# Patient Record
Sex: Female | Born: 1955 | Race: White | Hispanic: No | Smoking: Former smoker
Health system: Southern US, Community
[De-identification: ages and names within clinical notes are randomized; demographics above are authoritative.]

## PROBLEM LIST (undated history)

## (undated) DIAGNOSIS — I10 Essential (primary) hypertension: Secondary | ICD-10-CM

## (undated) DIAGNOSIS — E7800 Pure hypercholesterolemia, unspecified: Secondary | ICD-10-CM

## (undated) DIAGNOSIS — E119 Type 2 diabetes mellitus without complications: Secondary | ICD-10-CM

## (undated) DIAGNOSIS — M1990 Unspecified osteoarthritis, unspecified site: Secondary | ICD-10-CM

## (undated) HISTORY — PX: ABDOMINAL HYSTERECTOMY: SHX81

## (undated) HISTORY — PX: FRACTURE SURGERY: SHX138

---

## 1998-01-23 ENCOUNTER — Other Ambulatory Visit: Admission: RE | Admit: 1998-01-23 | Discharge: 1998-01-23 | Payer: Self-pay | Admitting: Obstetrics & Gynecology

## 1998-06-21 ENCOUNTER — Inpatient Hospital Stay (HOSPITAL_COMMUNITY): Admission: RE | Admit: 1998-06-21 | Discharge: 1998-06-24 | Payer: Self-pay | Admitting: Obstetrics & Gynecology

## 1999-08-23 ENCOUNTER — Encounter: Admission: RE | Admit: 1999-08-23 | Discharge: 1999-08-23 | Payer: Self-pay | Admitting: Obstetrics and Gynecology

## 1999-08-23 ENCOUNTER — Encounter: Payer: Self-pay | Admitting: Obstetrics and Gynecology

## 2000-10-14 ENCOUNTER — Encounter: Admission: RE | Admit: 2000-10-14 | Discharge: 2000-10-14 | Payer: Self-pay | Admitting: Obstetrics and Gynecology

## 2000-10-14 ENCOUNTER — Encounter: Payer: Self-pay | Admitting: Obstetrics and Gynecology

## 2000-10-30 ENCOUNTER — Other Ambulatory Visit: Admission: RE | Admit: 2000-10-30 | Discharge: 2000-10-30 | Payer: Self-pay | Admitting: Obstetrics and Gynecology

## 2001-08-10 ENCOUNTER — Ambulatory Visit (HOSPITAL_COMMUNITY): Admission: RE | Admit: 2001-08-10 | Discharge: 2001-08-10 | Payer: Self-pay | Admitting: Gastroenterology

## 2001-08-10 ENCOUNTER — Encounter (INDEPENDENT_AMBULATORY_CARE_PROVIDER_SITE_OTHER): Payer: Self-pay | Admitting: Specialist

## 2002-01-06 ENCOUNTER — Other Ambulatory Visit: Admission: RE | Admit: 2002-01-06 | Discharge: 2002-01-06 | Payer: Self-pay | Admitting: Obstetrics and Gynecology

## 2003-01-27 ENCOUNTER — Encounter: Payer: Self-pay | Admitting: Obstetrics and Gynecology

## 2003-01-27 ENCOUNTER — Encounter: Admission: RE | Admit: 2003-01-27 | Discharge: 2003-01-27 | Payer: Self-pay | Admitting: Obstetrics and Gynecology

## 2005-06-06 ENCOUNTER — Encounter: Admission: RE | Admit: 2005-06-06 | Discharge: 2005-06-06 | Payer: Self-pay | Admitting: Obstetrics and Gynecology

## 2006-08-19 ENCOUNTER — Encounter: Admission: RE | Admit: 2006-08-19 | Discharge: 2006-08-19 | Payer: Self-pay | Admitting: Obstetrics and Gynecology

## 2008-06-01 ENCOUNTER — Encounter: Admission: RE | Admit: 2008-06-01 | Discharge: 2008-06-01 | Payer: Self-pay | Admitting: Obstetrics and Gynecology

## 2008-06-10 ENCOUNTER — Encounter: Admission: RE | Admit: 2008-06-10 | Discharge: 2008-06-10 | Payer: Self-pay | Admitting: Internal Medicine

## 2009-06-23 ENCOUNTER — Encounter: Admission: RE | Admit: 2009-06-23 | Discharge: 2009-06-23 | Payer: Self-pay | Admitting: Obstetrics and Gynecology

## 2010-09-23 ENCOUNTER — Encounter: Payer: Self-pay | Admitting: Internal Medicine

## 2011-01-18 NOTE — Procedures (Signed)
[place]  Patient:    Christine Lopez, Christine Lopez Visit Number: [id number] MRN: [id number]          Service Type: END Location: ENDO Attending Physician:  Nelda Marseille Dictated by:   Petra Kuba, M.D. Proc. Date: 08/10/01 Admit Date:  08/10/2001                             Procedure Report  PROCEDURE:  Colonoscopy with biopsy.  INDICATIONS FOR PROCEDURE:  Some change in bowel habits with loose stools and some weight loss.  CONSENT:  Consent was signed after risks, benefits, methods, and options were thoroughly discussed in the office.  MEDICATIONS USED: 1. Demerol 50 mg. 2. Versed 7.5 mg.  DESCRIPTION OF PROCEDURE:  Rectal inspection was pertinent for external hemorrhoids, small.  Digital examination was negative.  Pediatric video adjustable colonoscope was inserted, easily advanced to the cecum.  However, to advance into the ileocecal valve, required rolling her on her back and some abdominal pressure.  The cecum was identified by the appendiceal orifice and ileocecal valve.  No obvious abnormality was seen on insertion.  The TI on quick evaluation was normal.  We did take a few biopsies from that area.  The scope was slowly withdrawn.  The prep was adequate.  There was some liquid stool that required washing and suctioning.  On slow withdrawal through the colon, some random biopsies were obtained, but no abnormalities were seen as we slowly withdrew back to the rectum.  They were put in the second container. Once back in the rectum the scope was retroflexed, pertinent for some internal hemorrhoids.  Scope was straightened and readvanced a short ways around the left side of the colon.  Air was suctioned, scope removed.  The patient tolerated the procedure well.  There were no obvious immediate complications.  ENDOSCOPIC DIAGNOSES: 1. Internal external hemorrhoids. 2. Otherwise within normal limits to the terminal ileum, status post  random    biopsies throughout.  PLAN: 1. Await pathology. 2. Continue Robinul since it is helping. 3. Consider Librax next. 4. Follow up p.r.n. or in 2 to 3 months to recheck symptoms, and make sure no    further workup plans are needed. Dictated by:   Petra Kuba, M.D. Attending Physician:  Nelda Marseille DD:  08/10/01 TD:  08/10/01 Job: [phone]

## 2011-05-30 ENCOUNTER — Other Ambulatory Visit: Payer: Self-pay | Admitting: Obstetrics and Gynecology

## 2011-05-30 DIAGNOSIS — Z1231 Encounter for screening mammogram for malignant neoplasm of breast: Secondary | ICD-10-CM

## 2011-06-11 ENCOUNTER — Ambulatory Visit
Admission: RE | Admit: 2011-06-11 | Discharge: 2011-06-11 | Disposition: A | Discharge status: Home / Self Care | Payer: BC Managed Care – PPO | Source: Ambulatory Visit | Attending: Obstetrics and Gynecology | Admitting: Obstetrics and Gynecology

## 2011-06-11 DIAGNOSIS — Z1231 Encounter for screening mammogram for malignant neoplasm of breast: Secondary | ICD-10-CM

## 2012-06-04 ENCOUNTER — Other Ambulatory Visit: Payer: Self-pay | Admitting: Family Medicine

## 2012-06-04 DIAGNOSIS — Z1231 Encounter for screening mammogram for malignant neoplasm of breast: Secondary | ICD-10-CM

## 2012-06-16 ENCOUNTER — Ambulatory Visit
Admission: RE | Admit: 2012-06-16 | Discharge: 2012-06-16 | Disposition: A | Discharge status: Home / Self Care | Payer: BC Managed Care – PPO | Source: Ambulatory Visit | Attending: Family Medicine | Admitting: Family Medicine

## 2012-06-16 DIAGNOSIS — Z1231 Encounter for screening mammogram for malignant neoplasm of breast: Secondary | ICD-10-CM

## 2012-06-29 ENCOUNTER — Other Ambulatory Visit: Payer: Self-pay | Admitting: Gastroenterology

## 2013-05-21 ENCOUNTER — Other Ambulatory Visit: Payer: Self-pay

## 2013-05-21 DIAGNOSIS — Z1231 Encounter for screening mammogram for malignant neoplasm of breast: Secondary | ICD-10-CM

## 2013-06-17 ENCOUNTER — Ambulatory Visit
Admission: RE | Admit: 2013-06-17 | Discharge: 2013-06-17 | Disposition: A | Discharge status: Home / Self Care | Payer: BC Managed Care – PPO | Source: Ambulatory Visit

## 2013-06-17 DIAGNOSIS — Z1231 Encounter for screening mammogram for malignant neoplasm of breast: Secondary | ICD-10-CM

## 2014-06-03 ENCOUNTER — Other Ambulatory Visit: Payer: Self-pay

## 2014-06-03 DIAGNOSIS — Z1239 Encounter for other screening for malignant neoplasm of breast: Secondary | ICD-10-CM

## 2014-06-21 ENCOUNTER — Ambulatory Visit
Admission: RE | Admit: 2014-06-21 | Discharge: 2014-06-21 | Disposition: A | Discharge status: Home / Self Care | Payer: PRIVATE HEALTH INSURANCE | Source: Ambulatory Visit

## 2014-06-21 DIAGNOSIS — Z1239 Encounter for other screening for malignant neoplasm of breast: Secondary | ICD-10-CM

## 2014-06-23 ENCOUNTER — Other Ambulatory Visit: Payer: Self-pay | Admitting: Obstetrics and Gynecology

## 2014-06-23 DIAGNOSIS — R928 Other abnormal and inconclusive findings on diagnostic imaging of breast: Secondary | ICD-10-CM

## 2014-07-18 ENCOUNTER — Ambulatory Visit
Admission: RE | Admit: 2014-07-18 | Discharge: 2014-07-18 | Disposition: A | Discharge status: Home / Self Care | Payer: PRIVATE HEALTH INSURANCE | Source: Ambulatory Visit | Attending: Obstetrics and Gynecology | Admitting: Obstetrics and Gynecology

## 2014-07-18 DIAGNOSIS — R928 Other abnormal and inconclusive findings on diagnostic imaging of breast: Secondary | ICD-10-CM

## 2015-05-07 ENCOUNTER — Encounter (HOSPITAL_COMMUNITY): Payer: Self-pay | Admitting: *Deleted

## 2015-05-07 ENCOUNTER — Emergency Department (HOSPITAL_COMMUNITY)
Admission: EM | Admit: 2015-05-07 | Discharge: 2015-05-07 | Disposition: A | Discharge status: Home / Self Care | Payer: PRIVATE HEALTH INSURANCE | Attending: Emergency Medicine | Admitting: Emergency Medicine

## 2015-05-07 DIAGNOSIS — M79652 Pain in left thigh: Secondary | ICD-10-CM | POA: Diagnosis not present

## 2015-05-07 DIAGNOSIS — I10 Essential (primary) hypertension: Secondary | ICD-10-CM | POA: Diagnosis not present

## 2015-05-07 DIAGNOSIS — Z720 Tobacco use: Secondary | ICD-10-CM | POA: Diagnosis not present

## 2015-05-07 DIAGNOSIS — M25552 Pain in left hip: Secondary | ICD-10-CM

## 2015-05-07 DIAGNOSIS — M5442 Lumbago with sciatica, left side: Secondary | ICD-10-CM | POA: Diagnosis not present

## 2015-05-07 DIAGNOSIS — M5432 Sciatica, left side: Secondary | ICD-10-CM

## 2015-05-07 HISTORY — DX: Essential (primary) hypertension: I10

## 2015-05-07 MED ORDER — OXYCODONE-ACETAMINOPHEN 5-325 MG PO TABS: 1.0000 | ORAL_TABLET | Freq: Three times a day (TID) | ORAL | Status: DC | PRN

## 2015-05-07 MED ORDER — PREDNISONE 20 MG PO TABS
60.0000 mg | ORAL_TABLET | Freq: Once | ORAL | Status: AC
  Administered 2015-05-07: 60 mg via ORAL
  Filled 2015-05-07: qty 3

## 2015-05-07 MED ORDER — METHOCARBAMOL 500 MG PO TABS: 500.0000 mg | ORAL_TABLET | Freq: Two times a day (BID) | ORAL | Status: DC

## 2015-05-07 MED ORDER — DIAZEPAM 5 MG PO TABS
10.0000 mg | ORAL_TABLET | Freq: Once | ORAL | Status: AC
  Administered 2015-05-07: 10 mg via ORAL
  Filled 2015-05-07: qty 2

## 2015-05-07 MED ORDER — PREDNISONE 20 MG PO TABS: ORAL_TABLET | ORAL | Status: DC

## 2015-05-07 MED ORDER — FENTANYL CITRATE (PF) 100 MCG/2ML IJ SOLN
100.0000 ug | Freq: Once | INTRAMUSCULAR | Status: AC
  Administered 2015-05-07: 100 ug via INTRAMUSCULAR
  Filled 2015-05-07: qty 2

## 2015-05-07 MED ORDER — OXYCODONE-ACETAMINOPHEN 5-325 MG PO TABS
2.0000 | ORAL_TABLET | Freq: Once | ORAL | Status: AC
  Administered 2015-05-07: 2 via ORAL
  Filled 2015-05-07: qty 2

## 2015-05-07 NOTE — ED Notes (Signed)
Declined W/C at D/C and was escorted to lobby by RN. 

## 2015-05-07 NOTE — ED Notes (Signed)
Pt states she is waiting for appt with ortho MD for L lower back pain that radiates to L leg.  The past few days the pain has gotten worse.

## 2015-05-07 NOTE — Discharge Instructions (Signed)
1. Medications: robaxin, prednisone, percocet, ibuprofen  3x per day, omeprazole every day, usual home medications 2. Treatment: rest, drink plenty of fluids, gentle stretching as discussed, alternate ice and heat 3. Follow Up: Please followup with your primary doctor in 3 days for discussion of your diagnoses and further evaluation after today's visit; if you do not have a primary care doctor use the resource guide provided to find one;  Return to the ER for worsening back pain, difficulty walking, loss of bowel or bladder control or other concerning symptoms    Radicular Pain Radicular pain in either the arm or leg is usually from a bulging or herniated disk in the spine. A piece of the herniated disk may press against the nerves as the nerves exit the spine. This causes pain which is felt at the tips of the nerves down the arm or leg. Other causes of radicular pain may include:  Fractures.  Heart disease.  Cancer.  An abnormal and usually degenerative state of the nervous system or nerves (neuropathy). Diagnosis may require CT or MRI scanning to determine the primary cause.  Nerves that start at the neck (nerve roots) may cause radicular pain in the outer shoulder and arm. It can spread down to the thumb and fingers. The symptoms vary depending on which nerve root has been affected. In most cases radicular pain improves with conservative treatment. Neck problems may require physical therapy, a neck collar, or cervical traction. Treatment may take many weeks, and surgery may be considered if the symptoms do not improve.  Conservative treatment is also recommended for sciatica. Sciatica causes pain to radiate from the lower back or buttock area down the leg into the foot. Often there is a history of back problems. Most patients with sciatica are better after 2 to 4 weeks of rest and other supportive care. Short term bed rest can reduce the disk pressure considerably. Sitting, however, is not  a good position since this increases the pressure on the disk. You should avoid bending, lifting, and all other activities which make the problem worse. Traction can be used in severe cases. Surgery is usually reserved for patients who do not improve within the first months of treatment. Only take over-the-counter or prescription medicines for pain, discomfort, or fever as directed by your caregiver. Narcotics and muscle relaxants may help by relieving more severe pain and spasm and by providing mild sedation. Cold or massage can give significant relief. Spinal manipulation is not recommended. It can increase the degree of disc protrusion. Epidural steroid injections are often effective treatment for radicular pain. These injections deliver medicine to the spinal nerve in the space between the protective covering of the spinal cord and back bones (vertebrae). Your caregiver can give you more information about steroid injections. These injections are most effective when given within two weeks of the onset of pain.  You should see your caregiver for follow up care as recommended. A program for neck and back injury rehabilitation with stretching and strengthening exercises is an important part of management.  SEEK IMMEDIATE MEDICAL CARE IF:  You develop increased pain, weakness, or numbness in your arm or leg.  You develop difficulty with bladder or bowel control.  You develop abdominal pain. Document Released: 09/26/2004 Document Revised: 11/11/2011 Document Reviewed: 12/12/2008 [facility] Patient Information 2015 [place], [place]. This information is not intended to replace advice given to you by your health care provider. Make sure you discuss any questions you have with your health care provider.

## 2015-05-07 NOTE — ED Provider Notes (Signed)
CSN: [id number]     Arrival date & time 05/07/15  1052 History  This chart was scribed for non-physician practitioner, Dierdre Forth, PA-C, working with Doug Sou, MD by Freida Busman, ED Scribe. This patient was seen in room TR11C/TR11C and the patient's care was started at 11:23 AM.  Chief Complaint  Patient presents with  . Hip Pain    The history is provided by the patient and medical records. No language interpreter was used.     HPI Comments:  Christine Lopez is a 59 y.o. female who presents to the Emergency Department complaining of "severe" lower back pain that initially began in June 2016 but has worsened in the last few days. She notes her pain has been constant and her pain radiates into her left hip and groin and down to her left thigh with intermittent tingling down her LLE. She has been taking ibuprofen and advil PM which initially provided mild relief. She has also taken half of her her husbands  roxycodone with mild relief. Pt has been evaluated twice for her pain. She was originally diagnosed with trochanteric bursitis but after a follow up appointment with her family doctor they believe her pain is due to sciatica. During that visit in July 2016 she was prescribed a course of  prednisone which provided moderate relief at the time. Pt notes she has an ortho appointment scheduled for 05/15/15 but states she would not be able to wait due to her pain. She has been able to ambulate and bear weight on her LLE. She notes  applying pressure to her back improves pain. She denies bowel/bladder incontinence, saddle anesthesia  Past Medical History  Diagnosis Date  . Hypertension    Past Surgical History  Procedure Laterality Date  . Abdominal hysterectomy     No family history on file. Social History  Substance Use Topics  . Smoking status: Current Some Day Smoker    Types: Cigarettes  . Smokeless tobacco: None  . Alcohol Use: No   OB History    No data available      Review of Systems  Constitutional: Negative for fever, chills and fatigue.  Respiratory: Negative for chest tightness and shortness of breath.   Cardiovascular: Negative for chest pain.  Gastrointestinal: Negative for nausea, vomiting, abdominal pain and diarrhea.  Genitourinary: Negative for dysuria, urgency, frequency and hematuria.  Musculoskeletal: Positive for myalgias, back pain and gait problem ( 2/2 pain). Negative for joint swelling, neck pain and neck stiffness.  Skin: Negative for rash.  Neurological: Negative for weakness, light-headedness, numbness and headaches.  All other systems reviewed and are negative.   Allergies  Review of patient's allergies indicates no known allergies.  Home Medications   Prior to Admission medications   Medication Sig Start Date End Date Taking? Authorizing Provider  methocarbamol (ROBAXIN) 500 MG tablet Take 1 tablet (500 mg total) by mouth 2 (two) times daily. 05/07/15   Tondalaya Perren, PA-C  oxyCODONE-acetaminophen (PERCOCET) 5-325 MG per tablet Take 1-2 tablets by mouth every 8 (eight) hours as needed. 05/07/15   Monicia Tse, PA-C  predniSONE (DELTASONE) 20 MG tablet 3 tabs po daily x 3 days, then 2 tabs x 3 days, then 1.5 tabs x 3 days, then 1 tab x 3 days, then 0.5 tabs x 3 days 05/07/15   Dahlia Client Abrea Henle, PA-C   BP 124/63 mmHg  Pulse 80  Temp(Src) 97.6 F (36.4 C) (Oral)  Resp 18  Ht 5' 2.5" (1.588 m)  Wt 174  lb (78.926 kg)  BMI 31.30 kg/m2  SpO2 99% Physical Exam  Constitutional: She appears well-developed and well-nourished. She appears distressed.  Pt sobbing in the wheelchair  HENT:  Head: Normocephalic and atraumatic.  Mouth/Throat: Oropharynx is clear and moist. No oropharyngeal exudate.  Eyes: Conjunctivae are normal.  Neck: Normal range of motion. Neck supple.  Full ROM without pain  Cardiovascular: Normal rate, regular rhythm, normal heart sounds and intact distal pulses.   No murmur  heard. Pulmonary/Chest: Effort normal and breath sounds normal. No respiratory distress. She has no wheezes.  Abdominal: Soft. She exhibits no distension. There is no tenderness.  Musculoskeletal:  Full range of motion of the T-spine and L-spine No tenderness to palpation of the spinous processes of the T-spine or L-spine Significant Tenderness to palpation of the left paraspinous muscles of the L-spine and SI joint Mild TTP of the left thigh Slightly decreased ROM of the left hip, but pt moves it easily  Lymphadenopathy:    She has no cervical adenopathy.  Neurological: She is alert. She has normal reflexes.  Reflex Scores:      Bicep reflexes are 2+ on the right side and 2+ on the left side.      Brachioradialis reflexes are 2+ on the right side and 2+ on the left side.      Patellar reflexes are 2+ on the right side and 2+ on the left side.      Achilles reflexes are 2+ on the right side and 2+ on the left side. Speech is clear and goal oriented, follows commands Normal 5/5 strength in upper and lower extremities bilaterally including dorsiflexion and plantar flexion, strong and equal grip strength Sensation normal to light and sharp touch Moves extremities without ataxia, coordination intact Antalgic gait Normal balance No Clonus   Skin: Skin is warm and dry. No rash noted. She is not diaphoretic. No erythema.  Psychiatric: She has a normal mood and affect. Her behavior is normal.  Nursing note and vitals reviewed.   ED Course  Procedures   DIAGNOSTIC STUDIES:  Oxygen Saturation is 98% on RA, normal by my interpretation.    COORDINATION OF CARE:  11:31AM Discussed treatment plan with pt at bedside and pt agreed to plan.  Labs Review Labs Reviewed - No data to display  Imaging Review No results found. I have personally reviewed and evaluated these images and lab results as part of my medical decision-making.   EKG Interpretation None      MDM   Final  diagnoses:  Sciatica, left  Left-sided low back pain with left-sided sciatica  Arthralgia of left thigh   Christine Lopez presents with severe back pain.  Nontraumatic without falls or known injury.  No red flags for back pain.  Will give pain control and reassess.  NO midline tenderness.  Discussed risk vs benefit of plain films and pt declines wishing to wait until she sees the orthopedist.  Pt requests an MRI, but there are no signs of cauda equina at this time.     BP 124/63 mmHg  Pulse 80  Temp(Src) 97.6 F (36.4 C) (Oral)  Resp 18  Ht 5' 2.5" (1.588 m)  Wt 174 lb (78.926 kg)  BMI 31.30 kg/m2  SpO2 99%  I personally performed the services described in this documentation, which was scribed in my presence. The recorded information has been reviewed and is accurate.  1:52 PM Pt with improved pain.  She is able to ambulate unassisted.  She wishes for discharge home.  Repeat gross neurological exam shows no deficits.  Patient can walk but states is painful.  No loss of bowel or bladder control.  No concern for cauda equina.  No fever, night sweats, weight loss, h/o cancer, IVDU.  RICE protocol and pain medicine indicated and discussed with patient.   Dahlia Client Phelicia Dantes, PA-C 05/07/15 1610  Doug Sou, MD 05/07/15 1729

## 2016-04-26 ENCOUNTER — Other Ambulatory Visit: Payer: Self-pay | Admitting: Family Medicine

## 2016-04-26 DIAGNOSIS — Z1231 Encounter for screening mammogram for malignant neoplasm of breast: Secondary | ICD-10-CM

## 2016-04-29 ENCOUNTER — Ambulatory Visit: Payer: Commercial Managed Care - PPO

## 2016-05-17 ENCOUNTER — Ambulatory Visit
Admission: RE | Admit: 2016-05-17 | Discharge: 2016-05-17 | Disposition: A | Discharge status: Home / Self Care | Payer: No Typology Code available for payment source | Source: Ambulatory Visit | Attending: Family Medicine | Admitting: Family Medicine

## 2016-05-17 DIAGNOSIS — Z1231 Encounter for screening mammogram for malignant neoplasm of breast: Secondary | ICD-10-CM

## 2017-06-20 ENCOUNTER — Other Ambulatory Visit: Payer: Self-pay | Admitting: Family Medicine

## 2017-06-20 DIAGNOSIS — Z1231 Encounter for screening mammogram for malignant neoplasm of breast: Secondary | ICD-10-CM

## 2017-07-16 ENCOUNTER — Ambulatory Visit
Admission: RE | Admit: 2017-07-16 | Discharge: 2017-07-16 | Disposition: A | Discharge status: Home / Self Care | Payer: No Typology Code available for payment source | Source: Ambulatory Visit | Attending: Family Medicine | Admitting: Family Medicine

## 2017-07-16 DIAGNOSIS — Z1231 Encounter for screening mammogram for malignant neoplasm of breast: Secondary | ICD-10-CM

## 2018-05-29 ENCOUNTER — Other Ambulatory Visit: Payer: Self-pay | Admitting: Family Medicine

## 2018-05-29 DIAGNOSIS — Z1231 Encounter for screening mammogram for malignant neoplasm of breast: Secondary | ICD-10-CM

## 2018-05-29 DIAGNOSIS — M8580 Other specified disorders of bone density and structure, unspecified site: Secondary | ICD-10-CM

## 2018-07-23 ENCOUNTER — Ambulatory Visit
Admission: RE | Admit: 2018-07-23 | Discharge: 2018-07-23 | Disposition: A | Discharge status: Home / Self Care | Payer: No Typology Code available for payment source | Source: Ambulatory Visit | Attending: Family Medicine | Admitting: Family Medicine

## 2018-07-23 DIAGNOSIS — M8580 Other specified disorders of bone density and structure, unspecified site: Secondary | ICD-10-CM

## 2018-07-23 DIAGNOSIS — Z1231 Encounter for screening mammogram for malignant neoplasm of breast: Secondary | ICD-10-CM

## 2019-08-11 ENCOUNTER — Other Ambulatory Visit: Payer: Self-pay | Admitting: Family Medicine

## 2019-08-11 DIAGNOSIS — Z1231 Encounter for screening mammogram for malignant neoplasm of breast: Secondary | ICD-10-CM

## 2019-08-12 ENCOUNTER — Other Ambulatory Visit: Payer: Self-pay

## 2019-08-12 ENCOUNTER — Ambulatory Visit
Admission: RE | Admit: 2019-08-12 | Discharge: 2019-08-12 | Disposition: A | Discharge status: Home / Self Care | Payer: No Typology Code available for payment source | Source: Ambulatory Visit | Attending: Family Medicine | Admitting: Family Medicine

## 2019-08-12 DIAGNOSIS — Z1231 Encounter for screening mammogram for malignant neoplasm of breast: Secondary | ICD-10-CM

## 2019-08-13 ENCOUNTER — Other Ambulatory Visit: Payer: Self-pay | Admitting: Family Medicine

## 2019-08-13 DIAGNOSIS — R928 Other abnormal and inconclusive findings on diagnostic imaging of breast: Secondary | ICD-10-CM

## 2019-08-24 ENCOUNTER — Ambulatory Visit: Admission: RE | Admit: 2019-08-24 | Payer: No Typology Code available for payment source | Source: Ambulatory Visit

## 2019-08-24 ENCOUNTER — Ambulatory Visit
Admission: RE | Admit: 2019-08-24 | Discharge: 2019-08-24 | Disposition: A | Discharge status: Home / Self Care | Payer: No Typology Code available for payment source | Source: Ambulatory Visit | Attending: Family Medicine | Admitting: Family Medicine

## 2019-08-24 ENCOUNTER — Other Ambulatory Visit: Payer: Self-pay

## 2019-08-24 DIAGNOSIS — R928 Other abnormal and inconclusive findings on diagnostic imaging of breast: Secondary | ICD-10-CM

## 2020-05-11 DIAGNOSIS — M545 Low back pain: Secondary | ICD-10-CM | POA: Diagnosis not present

## 2020-06-09 DIAGNOSIS — M545 Low back pain, unspecified: Secondary | ICD-10-CM | POA: Diagnosis not present

## 2020-06-15 DIAGNOSIS — M545 Low back pain, unspecified: Secondary | ICD-10-CM | POA: Diagnosis not present

## 2020-06-19 DIAGNOSIS — M48061 Spinal stenosis, lumbar region without neurogenic claudication: Secondary | ICD-10-CM | POA: Diagnosis not present

## 2020-06-29 DIAGNOSIS — R5383 Other fatigue: Secondary | ICD-10-CM | POA: Diagnosis not present

## 2020-06-29 DIAGNOSIS — R739 Hyperglycemia, unspecified: Secondary | ICD-10-CM | POA: Diagnosis not present

## 2020-06-29 DIAGNOSIS — E782 Mixed hyperlipidemia: Secondary | ICD-10-CM | POA: Diagnosis not present

## 2020-07-04 DIAGNOSIS — E782 Mixed hyperlipidemia: Secondary | ICD-10-CM | POA: Diagnosis not present

## 2020-07-04 DIAGNOSIS — E119 Type 2 diabetes mellitus without complications: Secondary | ICD-10-CM | POA: Diagnosis not present

## 2020-07-04 DIAGNOSIS — M48061 Spinal stenosis, lumbar region without neurogenic claudication: Secondary | ICD-10-CM | POA: Diagnosis not present

## 2020-07-04 DIAGNOSIS — I10 Essential (primary) hypertension: Secondary | ICD-10-CM | POA: Diagnosis not present

## 2020-08-02 DIAGNOSIS — I10 Essential (primary) hypertension: Secondary | ICD-10-CM | POA: Diagnosis not present

## 2020-08-02 DIAGNOSIS — E669 Obesity, unspecified: Secondary | ICD-10-CM | POA: Diagnosis not present

## 2020-08-02 DIAGNOSIS — E119 Type 2 diabetes mellitus without complications: Secondary | ICD-10-CM | POA: Diagnosis not present

## 2020-08-02 DIAGNOSIS — E782 Mixed hyperlipidemia: Secondary | ICD-10-CM | POA: Diagnosis not present

## 2020-08-04 DIAGNOSIS — M48062 Spinal stenosis, lumbar region with neurogenic claudication: Secondary | ICD-10-CM | POA: Diagnosis not present

## 2020-08-22 DIAGNOSIS — T1512XA Foreign body in conjunctival sac, left eye, initial encounter: Secondary | ICD-10-CM | POA: Diagnosis not present

## 2020-09-06 DIAGNOSIS — M48062 Spinal stenosis, lumbar region with neurogenic claudication: Secondary | ICD-10-CM | POA: Diagnosis not present

## 2020-09-29 DIAGNOSIS — M48062 Spinal stenosis, lumbar region with neurogenic claudication: Secondary | ICD-10-CM | POA: Diagnosis not present

## 2020-11-30 DIAGNOSIS — I10 Essential (primary) hypertension: Secondary | ICD-10-CM | POA: Diagnosis not present

## 2020-11-30 DIAGNOSIS — E119 Type 2 diabetes mellitus without complications: Secondary | ICD-10-CM | POA: Diagnosis not present

## 2020-11-30 DIAGNOSIS — E669 Obesity, unspecified: Secondary | ICD-10-CM | POA: Diagnosis not present

## 2020-11-30 DIAGNOSIS — E782 Mixed hyperlipidemia: Secondary | ICD-10-CM | POA: Diagnosis not present

## 2020-12-26 DIAGNOSIS — H1131 Conjunctival hemorrhage, right eye: Secondary | ICD-10-CM | POA: Diagnosis not present

## 2021-01-19 DIAGNOSIS — E782 Mixed hyperlipidemia: Secondary | ICD-10-CM | POA: Diagnosis not present

## 2021-01-19 DIAGNOSIS — I10 Essential (primary) hypertension: Secondary | ICD-10-CM | POA: Diagnosis not present

## 2021-01-19 DIAGNOSIS — E669 Obesity, unspecified: Secondary | ICD-10-CM | POA: Diagnosis not present

## 2021-01-19 DIAGNOSIS — E1165 Type 2 diabetes mellitus with hyperglycemia: Secondary | ICD-10-CM | POA: Diagnosis not present

## 2021-01-24 DIAGNOSIS — E1165 Type 2 diabetes mellitus with hyperglycemia: Secondary | ICD-10-CM | POA: Diagnosis not present

## 2021-01-24 DIAGNOSIS — E1169 Type 2 diabetes mellitus with other specified complication: Secondary | ICD-10-CM | POA: Diagnosis not present

## 2021-01-24 DIAGNOSIS — E782 Mixed hyperlipidemia: Secondary | ICD-10-CM | POA: Diagnosis not present

## 2021-01-24 DIAGNOSIS — I10 Essential (primary) hypertension: Secondary | ICD-10-CM | POA: Diagnosis not present

## 2021-01-30 DIAGNOSIS — R7303 Prediabetes: Secondary | ICD-10-CM | POA: Diagnosis not present

## 2021-01-30 DIAGNOSIS — E669 Obesity, unspecified: Secondary | ICD-10-CM | POA: Diagnosis not present

## 2021-01-30 DIAGNOSIS — E782 Mixed hyperlipidemia: Secondary | ICD-10-CM | POA: Diagnosis not present

## 2021-01-30 DIAGNOSIS — E119 Type 2 diabetes mellitus without complications: Secondary | ICD-10-CM | POA: Diagnosis not present

## 2021-03-12 DIAGNOSIS — Z713 Dietary counseling and surveillance: Secondary | ICD-10-CM | POA: Diagnosis not present

## 2021-03-12 DIAGNOSIS — E669 Obesity, unspecified: Secondary | ICD-10-CM | POA: Diagnosis not present

## 2021-04-27 DIAGNOSIS — E669 Obesity, unspecified: Secondary | ICD-10-CM | POA: Diagnosis not present

## 2021-05-21 DIAGNOSIS — Z0000 Encounter for general adult medical examination without abnormal findings: Secondary | ICD-10-CM | POA: Diagnosis not present

## 2021-05-23 DIAGNOSIS — M8580 Other specified disorders of bone density and structure, unspecified site: Secondary | ICD-10-CM | POA: Diagnosis not present

## 2021-05-23 DIAGNOSIS — B359 Dermatophytosis, unspecified: Secondary | ICD-10-CM | POA: Diagnosis not present

## 2021-05-23 DIAGNOSIS — Z23 Encounter for immunization: Secondary | ICD-10-CM | POA: Diagnosis not present

## 2021-05-23 DIAGNOSIS — Z0000 Encounter for general adult medical examination without abnormal findings: Secondary | ICD-10-CM | POA: Diagnosis not present

## 2021-05-23 DIAGNOSIS — Z1331 Encounter for screening for depression: Secondary | ICD-10-CM | POA: Diagnosis not present

## 2021-05-23 DIAGNOSIS — Z1211 Encounter for screening for malignant neoplasm of colon: Secondary | ICD-10-CM | POA: Diagnosis not present

## 2021-05-23 DIAGNOSIS — M79643 Pain in unspecified hand: Secondary | ICD-10-CM | POA: Diagnosis not present

## 2021-05-23 DIAGNOSIS — Z1339 Encounter for screening examination for other mental health and behavioral disorders: Secondary | ICD-10-CM | POA: Diagnosis not present

## 2021-05-28 ENCOUNTER — Other Ambulatory Visit: Payer: Self-pay | Admitting: Family Medicine

## 2021-05-28 DIAGNOSIS — E2839 Other primary ovarian failure: Secondary | ICD-10-CM

## 2021-05-31 ENCOUNTER — Other Ambulatory Visit: Payer: Self-pay | Admitting: Family Medicine

## 2021-05-31 DIAGNOSIS — Z1231 Encounter for screening mammogram for malignant neoplasm of breast: Secondary | ICD-10-CM

## 2021-06-13 DIAGNOSIS — E559 Vitamin D deficiency, unspecified: Secondary | ICD-10-CM | POA: Diagnosis not present

## 2021-06-13 DIAGNOSIS — G2581 Restless legs syndrome: Secondary | ICD-10-CM | POA: Diagnosis not present

## 2021-06-13 DIAGNOSIS — M8580 Other specified disorders of bone density and structure, unspecified site: Secondary | ICD-10-CM | POA: Diagnosis not present

## 2021-06-13 DIAGNOSIS — B359 Dermatophytosis, unspecified: Secondary | ICD-10-CM | POA: Diagnosis not present

## 2021-06-13 DIAGNOSIS — M1990 Unspecified osteoarthritis, unspecified site: Secondary | ICD-10-CM | POA: Diagnosis not present

## 2021-06-21 DIAGNOSIS — E559 Vitamin D deficiency, unspecified: Secondary | ICD-10-CM | POA: Diagnosis not present

## 2021-06-21 DIAGNOSIS — B359 Dermatophytosis, unspecified: Secondary | ICD-10-CM | POA: Diagnosis not present

## 2021-06-21 DIAGNOSIS — Z7185 Encounter for immunization safety counseling: Secondary | ICD-10-CM | POA: Diagnosis not present

## 2021-06-21 DIAGNOSIS — M19049 Primary osteoarthritis, unspecified hand: Secondary | ICD-10-CM | POA: Diagnosis not present

## 2021-06-28 ENCOUNTER — Other Ambulatory Visit: Payer: Self-pay

## 2021-06-28 ENCOUNTER — Ambulatory Visit
Admission: RE | Admit: 2021-06-28 | Discharge: 2021-06-28 | Disposition: A | Discharge status: Home / Self Care | Payer: Medicare HMO | Source: Ambulatory Visit | Attending: Family Medicine | Admitting: Family Medicine

## 2021-06-28 DIAGNOSIS — Z1231 Encounter for screening mammogram for malignant neoplasm of breast: Secondary | ICD-10-CM

## 2021-07-03 ENCOUNTER — Other Ambulatory Visit: Payer: Self-pay | Admitting: Family Medicine

## 2021-07-03 DIAGNOSIS — R928 Other abnormal and inconclusive findings on diagnostic imaging of breast: Secondary | ICD-10-CM

## 2021-07-25 DIAGNOSIS — E669 Obesity, unspecified: Secondary | ICD-10-CM | POA: Diagnosis not present

## 2021-07-25 DIAGNOSIS — Z8639 Personal history of other endocrine, nutritional and metabolic disease: Secondary | ICD-10-CM | POA: Diagnosis not present

## 2021-07-25 DIAGNOSIS — I10 Essential (primary) hypertension: Secondary | ICD-10-CM | POA: Diagnosis not present

## 2021-07-25 DIAGNOSIS — M1990 Unspecified osteoarthritis, unspecified site: Secondary | ICD-10-CM | POA: Diagnosis not present

## 2021-08-08 ENCOUNTER — Other Ambulatory Visit: Payer: Self-pay | Admitting: Family Medicine

## 2021-08-08 ENCOUNTER — Ambulatory Visit
Admission: RE | Admit: 2021-08-08 | Discharge: 2021-08-08 | Disposition: A | Discharge status: Home / Self Care | Payer: Medicare HMO | Source: Ambulatory Visit | Attending: Family Medicine | Admitting: Family Medicine

## 2021-08-08 DIAGNOSIS — R928 Other abnormal and inconclusive findings on diagnostic imaging of breast: Secondary | ICD-10-CM

## 2021-08-08 DIAGNOSIS — R921 Mammographic calcification found on diagnostic imaging of breast: Secondary | ICD-10-CM | POA: Diagnosis not present

## 2021-08-08 DIAGNOSIS — R922 Inconclusive mammogram: Secondary | ICD-10-CM | POA: Diagnosis not present

## 2021-10-16 DIAGNOSIS — Z8639 Personal history of other endocrine, nutritional and metabolic disease: Secondary | ICD-10-CM | POA: Diagnosis not present

## 2021-10-16 DIAGNOSIS — E782 Mixed hyperlipidemia: Secondary | ICD-10-CM | POA: Diagnosis not present

## 2021-10-16 DIAGNOSIS — I10 Essential (primary) hypertension: Secondary | ICD-10-CM | POA: Diagnosis not present

## 2021-10-19 DIAGNOSIS — R7989 Other specified abnormal findings of blood chemistry: Secondary | ICD-10-CM | POA: Diagnosis not present

## 2021-10-19 DIAGNOSIS — E1165 Type 2 diabetes mellitus with hyperglycemia: Secondary | ICD-10-CM | POA: Diagnosis not present

## 2021-10-19 DIAGNOSIS — E782 Mixed hyperlipidemia: Secondary | ICD-10-CM | POA: Diagnosis not present

## 2021-10-19 DIAGNOSIS — I10 Essential (primary) hypertension: Secondary | ICD-10-CM | POA: Diagnosis not present

## 2021-11-08 ENCOUNTER — Ambulatory Visit
Admission: RE | Admit: 2021-11-08 | Discharge: 2021-11-08 | Disposition: A | Discharge status: Home / Self Care | Payer: Medicare HMO | Source: Ambulatory Visit | Attending: Family Medicine | Admitting: Family Medicine

## 2021-11-08 DIAGNOSIS — M85851 Other specified disorders of bone density and structure, right thigh: Secondary | ICD-10-CM | POA: Diagnosis not present

## 2021-11-08 DIAGNOSIS — Z780 Asymptomatic menopausal state: Secondary | ICD-10-CM | POA: Diagnosis not present

## 2021-11-08 DIAGNOSIS — E2839 Other primary ovarian failure: Secondary | ICD-10-CM

## 2022-01-03 DIAGNOSIS — E1165 Type 2 diabetes mellitus with hyperglycemia: Secondary | ICD-10-CM | POA: Diagnosis not present

## 2022-01-03 DIAGNOSIS — R790 Abnormal level of blood mineral: Secondary | ICD-10-CM | POA: Diagnosis not present

## 2022-01-08 DIAGNOSIS — I10 Essential (primary) hypertension: Secondary | ICD-10-CM | POA: Diagnosis not present

## 2022-01-08 DIAGNOSIS — E1165 Type 2 diabetes mellitus with hyperglycemia: Secondary | ICD-10-CM | POA: Diagnosis not present

## 2022-01-08 DIAGNOSIS — K5900 Constipation, unspecified: Secondary | ICD-10-CM | POA: Diagnosis not present

## 2022-01-11 DIAGNOSIS — H524 Presbyopia: Secondary | ICD-10-CM | POA: Diagnosis not present

## 2022-01-11 DIAGNOSIS — E119 Type 2 diabetes mellitus without complications: Secondary | ICD-10-CM | POA: Diagnosis not present

## 2022-01-15 DIAGNOSIS — M25521 Pain in right elbow: Secondary | ICD-10-CM | POA: Diagnosis not present

## 2022-01-15 DIAGNOSIS — S52121A Displaced fracture of head of right radius, initial encounter for closed fracture: Secondary | ICD-10-CM | POA: Diagnosis not present

## 2022-01-16 DIAGNOSIS — M25521 Pain in right elbow: Secondary | ICD-10-CM | POA: Diagnosis not present

## 2022-01-17 DIAGNOSIS — Y999 Unspecified external cause status: Secondary | ICD-10-CM | POA: Diagnosis not present

## 2022-01-17 DIAGNOSIS — G8918 Other acute postprocedural pain: Secondary | ICD-10-CM | POA: Diagnosis not present

## 2022-01-17 DIAGNOSIS — S52121A Displaced fracture of head of right radius, initial encounter for closed fracture: Secondary | ICD-10-CM | POA: Diagnosis not present

## 2022-01-17 DIAGNOSIS — S53104A Unspecified dislocation of right ulnohumeral joint, initial encounter: Secondary | ICD-10-CM | POA: Diagnosis not present

## 2022-01-17 DIAGNOSIS — S53431A Radial collateral ligament sprain of right elbow, initial encounter: Secondary | ICD-10-CM | POA: Diagnosis not present

## 2022-01-25 DIAGNOSIS — S5321XD Traumatic rupture of right radial collateral ligament, subsequent encounter: Secondary | ICD-10-CM | POA: Diagnosis not present

## 2022-01-25 DIAGNOSIS — M25521 Pain in right elbow: Secondary | ICD-10-CM | POA: Diagnosis not present

## 2022-01-25 DIAGNOSIS — S52121D Displaced fracture of head of right radius, subsequent encounter for closed fracture with routine healing: Secondary | ICD-10-CM | POA: Diagnosis not present

## 2022-01-31 DIAGNOSIS — S52121D Displaced fracture of head of right radius, subsequent encounter for closed fracture with routine healing: Secondary | ICD-10-CM | POA: Diagnosis not present

## 2022-01-31 DIAGNOSIS — S5321XD Traumatic rupture of right radial collateral ligament, subsequent encounter: Secondary | ICD-10-CM | POA: Diagnosis not present

## 2022-02-25 DIAGNOSIS — Z9889 Other specified postprocedural states: Secondary | ICD-10-CM | POA: Diagnosis not present

## 2022-03-01 DIAGNOSIS — S52121D Displaced fracture of head of right radius, subsequent encounter for closed fracture with routine healing: Secondary | ICD-10-CM | POA: Diagnosis not present

## 2022-03-01 DIAGNOSIS — S5321XD Traumatic rupture of right radial collateral ligament, subsequent encounter: Secondary | ICD-10-CM | POA: Diagnosis not present

## 2022-03-19 DIAGNOSIS — S52121D Displaced fracture of head of right radius, subsequent encounter for closed fracture with routine healing: Secondary | ICD-10-CM | POA: Diagnosis not present

## 2022-03-19 DIAGNOSIS — S5321XD Traumatic rupture of right radial collateral ligament, subsequent encounter: Secondary | ICD-10-CM | POA: Diagnosis not present

## 2022-03-21 DIAGNOSIS — S5321XD Traumatic rupture of right radial collateral ligament, subsequent encounter: Secondary | ICD-10-CM | POA: Diagnosis not present

## 2022-03-21 DIAGNOSIS — S52121D Displaced fracture of head of right radius, subsequent encounter for closed fracture with routine healing: Secondary | ICD-10-CM | POA: Diagnosis not present

## 2022-03-25 DIAGNOSIS — S5321XD Traumatic rupture of right radial collateral ligament, subsequent encounter: Secondary | ICD-10-CM | POA: Diagnosis not present

## 2022-03-25 DIAGNOSIS — S52121D Displaced fracture of head of right radius, subsequent encounter for closed fracture with routine healing: Secondary | ICD-10-CM | POA: Diagnosis not present

## 2022-03-28 DIAGNOSIS — S52121D Displaced fracture of head of right radius, subsequent encounter for closed fracture with routine healing: Secondary | ICD-10-CM | POA: Diagnosis not present

## 2022-03-28 DIAGNOSIS — S5321XD Traumatic rupture of right radial collateral ligament, subsequent encounter: Secondary | ICD-10-CM | POA: Diagnosis not present

## 2022-04-04 DIAGNOSIS — E1122 Type 2 diabetes mellitus with diabetic chronic kidney disease: Secondary | ICD-10-CM | POA: Diagnosis not present

## 2022-04-04 DIAGNOSIS — E1121 Type 2 diabetes mellitus with diabetic nephropathy: Secondary | ICD-10-CM | POA: Diagnosis not present

## 2022-04-04 DIAGNOSIS — I129 Hypertensive chronic kidney disease with stage 1 through stage 4 chronic kidney disease, or unspecified chronic kidney disease: Secondary | ICD-10-CM | POA: Diagnosis not present

## 2022-04-04 DIAGNOSIS — E1165 Type 2 diabetes mellitus with hyperglycemia: Secondary | ICD-10-CM | POA: Diagnosis not present

## 2022-04-04 DIAGNOSIS — I10 Essential (primary) hypertension: Secondary | ICD-10-CM | POA: Diagnosis not present

## 2022-04-04 DIAGNOSIS — K219 Gastro-esophageal reflux disease without esophagitis: Secondary | ICD-10-CM | POA: Diagnosis not present

## 2022-04-08 DIAGNOSIS — Z9889 Other specified postprocedural states: Secondary | ICD-10-CM | POA: Diagnosis not present

## 2022-04-08 DIAGNOSIS — S52121D Displaced fracture of head of right radius, subsequent encounter for closed fracture with routine healing: Secondary | ICD-10-CM | POA: Diagnosis not present

## 2022-04-09 DIAGNOSIS — S5321XD Traumatic rupture of right radial collateral ligament, subsequent encounter: Secondary | ICD-10-CM | POA: Diagnosis not present

## 2022-04-09 DIAGNOSIS — S52121D Displaced fracture of head of right radius, subsequent encounter for closed fracture with routine healing: Secondary | ICD-10-CM | POA: Diagnosis not present

## 2022-04-30 DIAGNOSIS — S52121D Displaced fracture of head of right radius, subsequent encounter for closed fracture with routine healing: Secondary | ICD-10-CM | POA: Diagnosis not present

## 2022-04-30 DIAGNOSIS — S5321XD Traumatic rupture of right radial collateral ligament, subsequent encounter: Secondary | ICD-10-CM | POA: Diagnosis not present

## 2022-05-02 DIAGNOSIS — I10 Essential (primary) hypertension: Secondary | ICD-10-CM | POA: Diagnosis not present

## 2022-05-02 DIAGNOSIS — K219 Gastro-esophageal reflux disease without esophagitis: Secondary | ICD-10-CM | POA: Diagnosis not present

## 2022-05-02 DIAGNOSIS — Z23 Encounter for immunization: Secondary | ICD-10-CM | POA: Diagnosis not present

## 2022-05-08 DIAGNOSIS — S5321XD Traumatic rupture of right radial collateral ligament, subsequent encounter: Secondary | ICD-10-CM | POA: Diagnosis not present

## 2022-05-08 DIAGNOSIS — S52121D Displaced fracture of head of right radius, subsequent encounter for closed fracture with routine healing: Secondary | ICD-10-CM | POA: Diagnosis not present

## 2022-05-20 DIAGNOSIS — S52121D Displaced fracture of head of right radius, subsequent encounter for closed fracture with routine healing: Secondary | ICD-10-CM | POA: Diagnosis not present

## 2022-05-21 DIAGNOSIS — S52121D Displaced fracture of head of right radius, subsequent encounter for closed fracture with routine healing: Secondary | ICD-10-CM | POA: Diagnosis not present

## 2022-05-21 DIAGNOSIS — S5321XD Traumatic rupture of right radial collateral ligament, subsequent encounter: Secondary | ICD-10-CM | POA: Diagnosis not present

## 2022-05-27 DIAGNOSIS — E559 Vitamin D deficiency, unspecified: Secondary | ICD-10-CM | POA: Diagnosis not present

## 2022-05-27 DIAGNOSIS — E782 Mixed hyperlipidemia: Secondary | ICD-10-CM | POA: Diagnosis not present

## 2022-05-27 DIAGNOSIS — Z0000 Encounter for general adult medical examination without abnormal findings: Secondary | ICD-10-CM | POA: Diagnosis not present

## 2022-05-27 DIAGNOSIS — Z114 Encounter for screening for human immunodeficiency virus [HIV]: Secondary | ICD-10-CM | POA: Diagnosis not present

## 2022-05-29 DIAGNOSIS — Z8639 Personal history of other endocrine, nutritional and metabolic disease: Secondary | ICD-10-CM | POA: Diagnosis not present

## 2022-05-29 DIAGNOSIS — E782 Mixed hyperlipidemia: Secondary | ICD-10-CM | POA: Diagnosis not present

## 2022-05-29 DIAGNOSIS — E669 Obesity, unspecified: Secondary | ICD-10-CM | POA: Diagnosis not present

## 2022-05-29 DIAGNOSIS — Z1339 Encounter for screening examination for other mental health and behavioral disorders: Secondary | ICD-10-CM | POA: Diagnosis not present

## 2022-05-29 DIAGNOSIS — Z1331 Encounter for screening for depression: Secondary | ICD-10-CM | POA: Diagnosis not present

## 2022-05-29 DIAGNOSIS — Z1211 Encounter for screening for malignant neoplasm of colon: Secondary | ICD-10-CM | POA: Diagnosis not present

## 2022-05-29 DIAGNOSIS — Z0000 Encounter for general adult medical examination without abnormal findings: Secondary | ICD-10-CM | POA: Diagnosis not present

## 2022-05-29 DIAGNOSIS — I10 Essential (primary) hypertension: Secondary | ICD-10-CM | POA: Diagnosis not present

## 2022-05-29 DIAGNOSIS — E559 Vitamin D deficiency, unspecified: Secondary | ICD-10-CM | POA: Diagnosis not present

## 2022-06-06 DIAGNOSIS — S5321XD Traumatic rupture of right radial collateral ligament, subsequent encounter: Secondary | ICD-10-CM | POA: Diagnosis not present

## 2022-06-06 DIAGNOSIS — S52121D Displaced fracture of head of right radius, subsequent encounter for closed fracture with routine healing: Secondary | ICD-10-CM | POA: Diagnosis not present

## 2022-09-03 DIAGNOSIS — I10 Essential (primary) hypertension: Secondary | ICD-10-CM | POA: Diagnosis not present

## 2022-09-03 DIAGNOSIS — Z87898 Personal history of other specified conditions: Secondary | ICD-10-CM | POA: Diagnosis not present

## 2022-09-03 DIAGNOSIS — R7303 Prediabetes: Secondary | ICD-10-CM | POA: Diagnosis not present

## 2022-09-21 IMAGING — MG MM DIGITAL SCREENING BILAT W/ TOMO AND CAD
8 series · 9 of 24 positions shown · non-contrast
Comparison: Previous exam(s).

CLINICAL DATA: Screening.

EXAM:
DIGITAL SCREENING BILATERAL MAMMOGRAM WITH TOMOSYNTHESIS AND CAD
TECHNIQUE: Bilateral screening digital craniocaudal and mediolateral oblique
mammograms were obtained. Bilateral screening digital breast
tomosynthesis was performed. The images were evaluated with
computer-aided detection.

[R CC synth-2D]
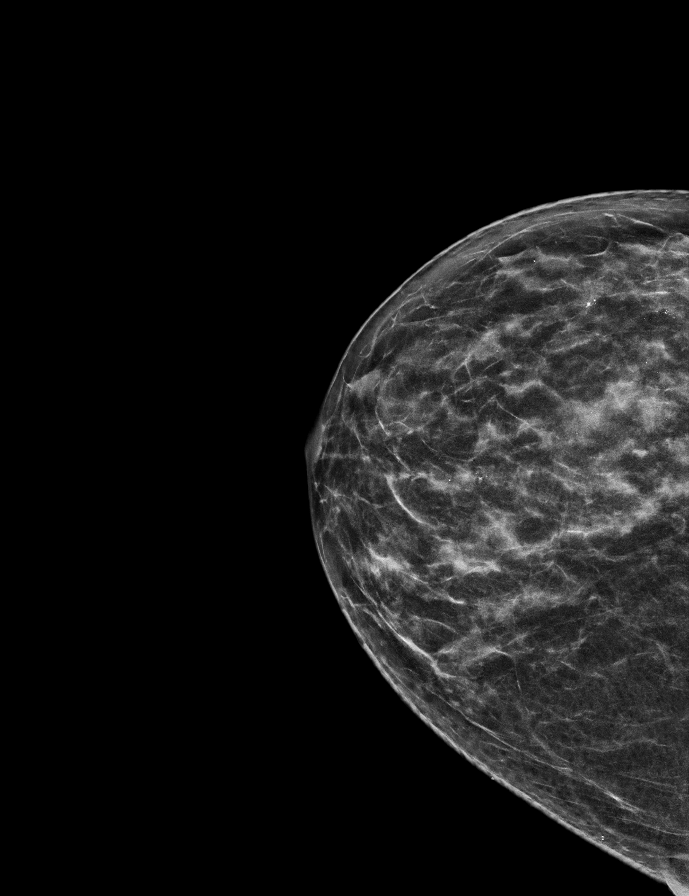

[L CC synth-2D]
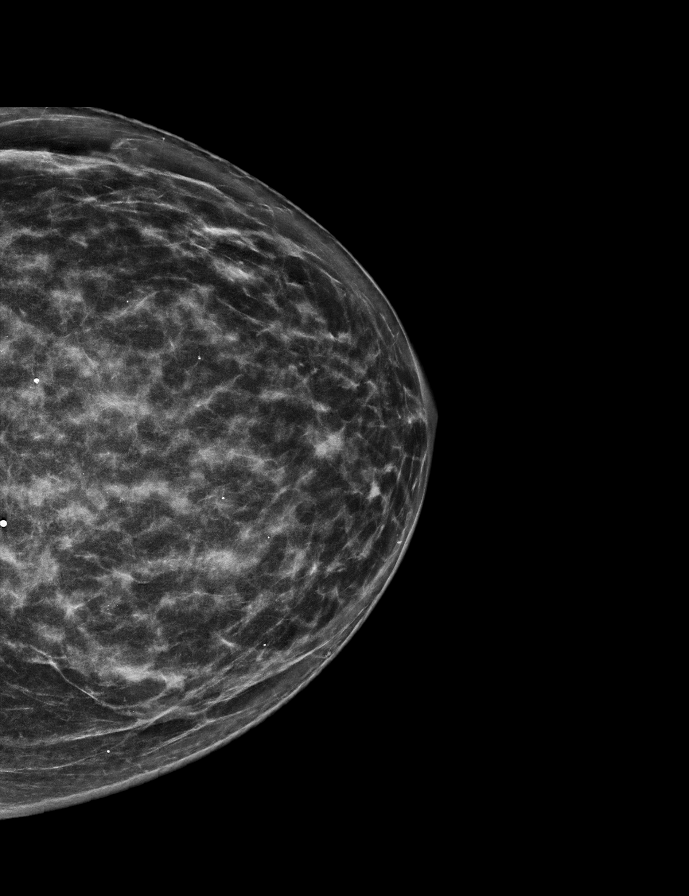

[R MLO synth-2D]
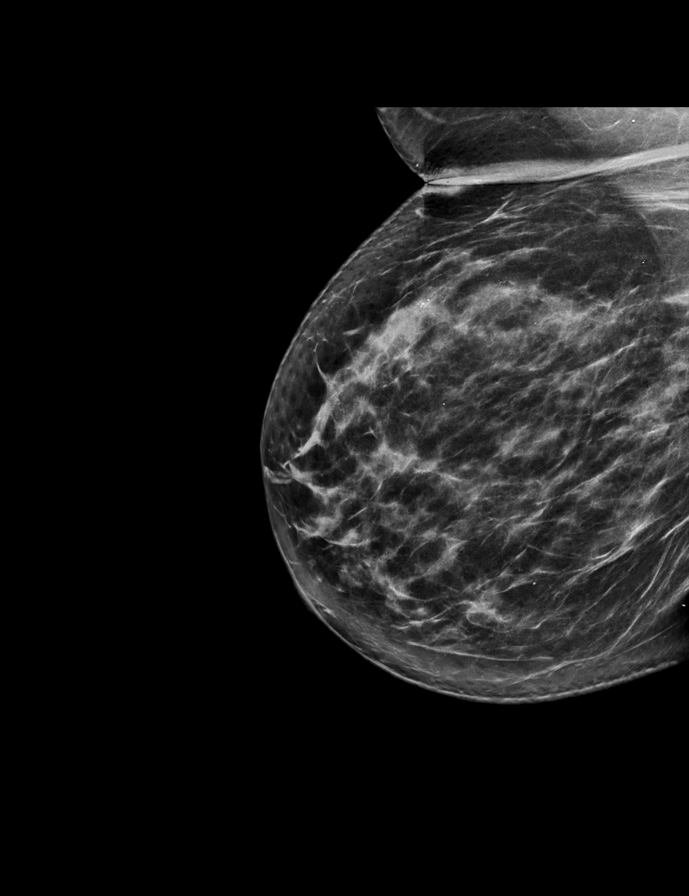

[L MLO synth-2D]
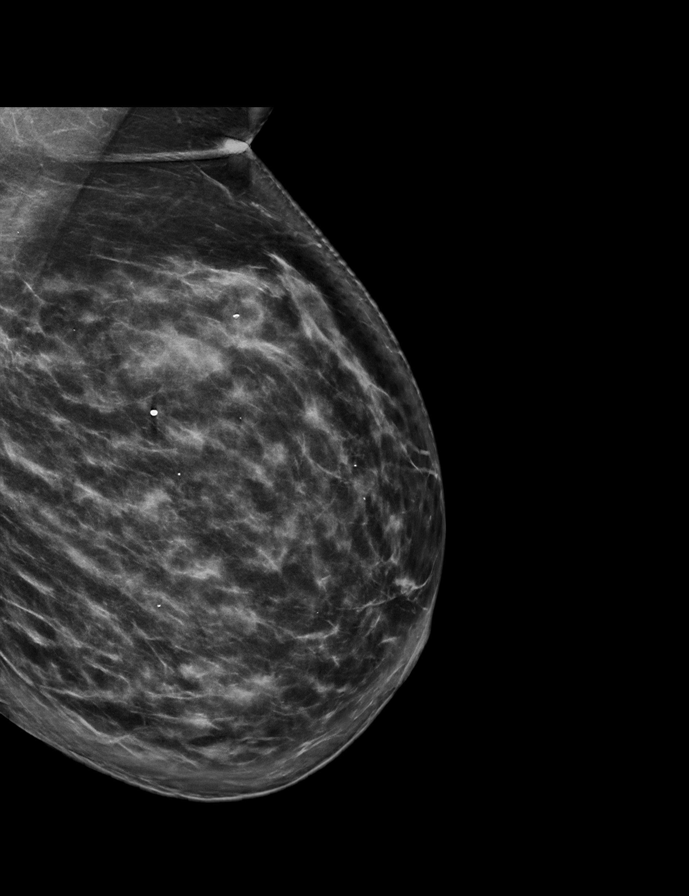

[L CC tomo · 2 of 60 frames shown]
[frame 20/60]
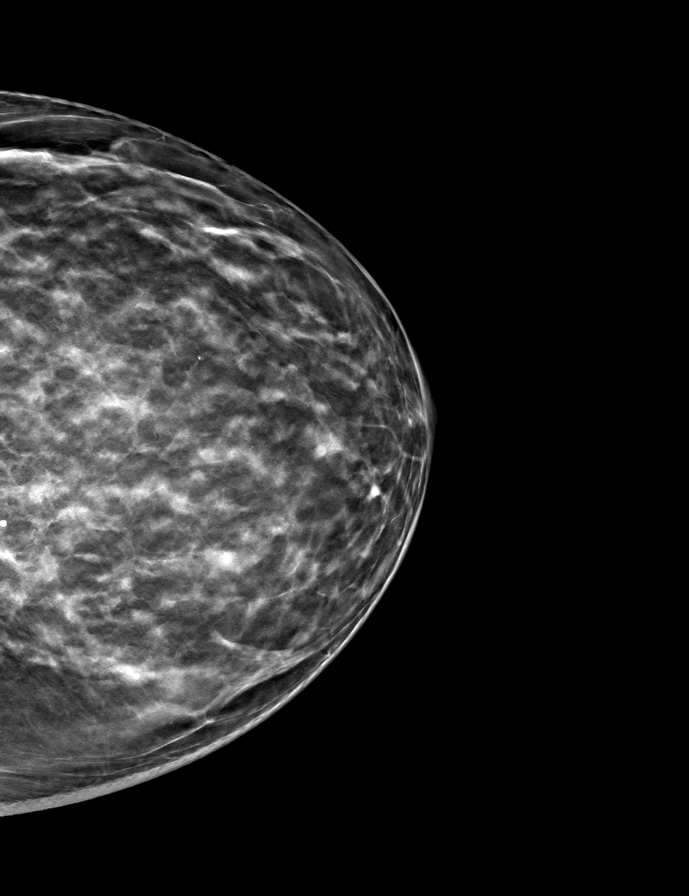
[frame 31/60]
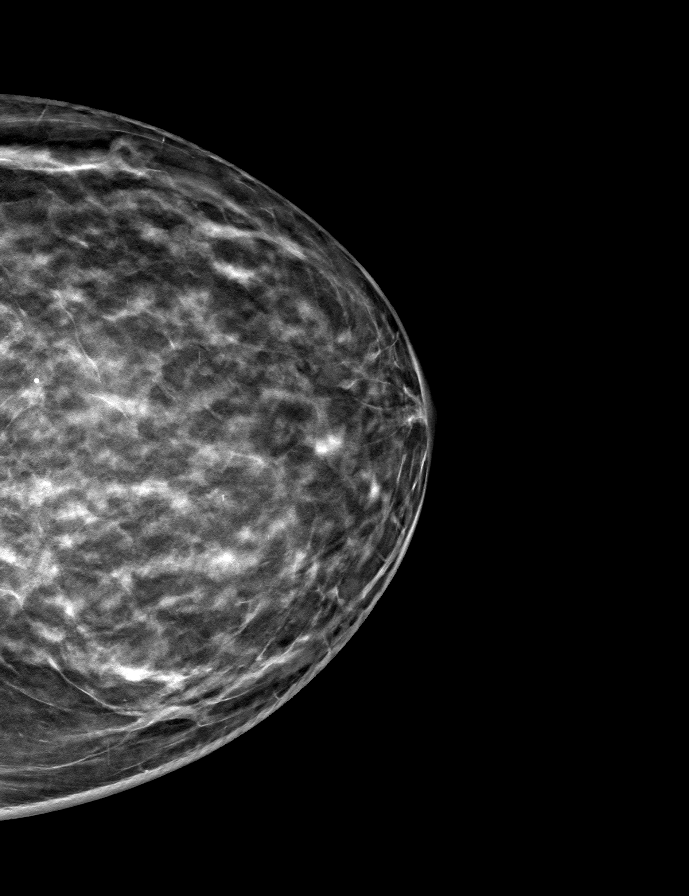

[R CC tomo · tomo slice 28/55.0]
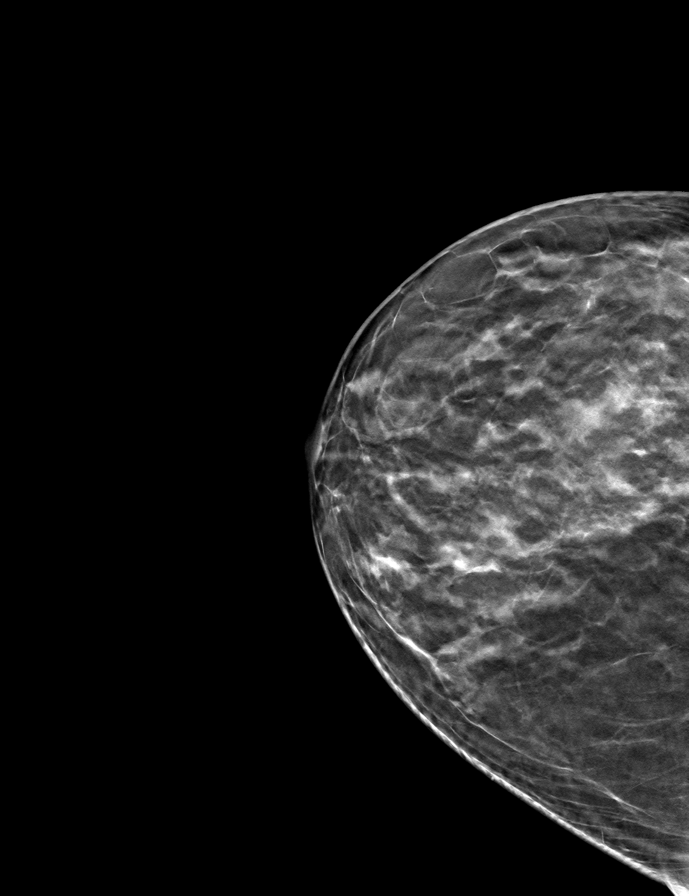

[L MLO tomo · tomo slice 39/78.0]
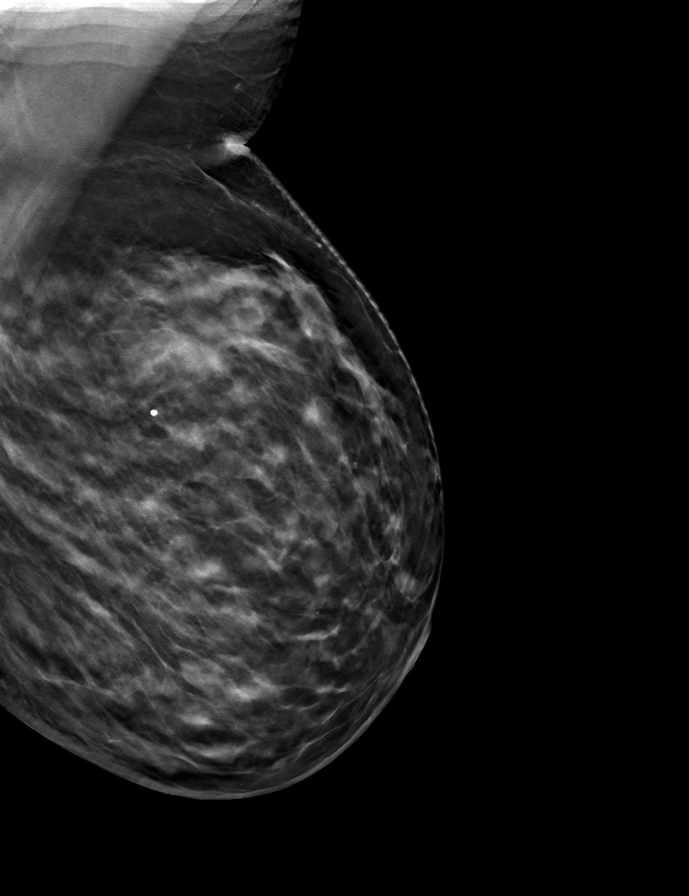

[R MLO tomo · tomo slice 35/69.0]
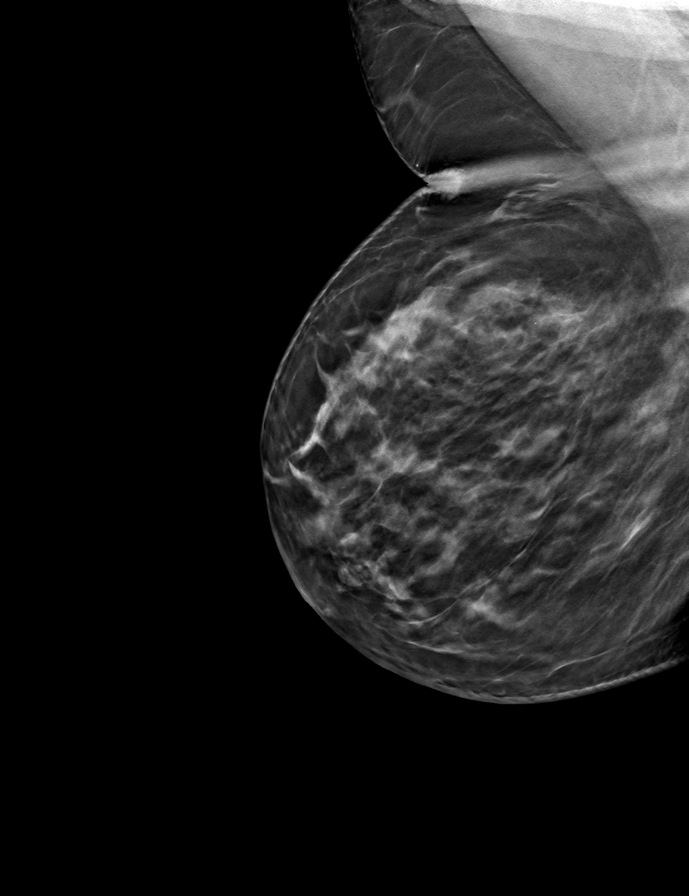

[9 of 24 positions shown; findings below may reference images not displayed]

ACR Breast Density Category c: The breast tissue is heterogeneously
dense, which may obscure small masses.
FINDINGS: In the right breast, calcifications warrant further evaluation with
magnified views. These calcifications are seen within the upper
RIGHT breast, MLO slice 53, with probable correlate on the cc view
within the outer RIGHT breast.

In the left breast, no findings suspicious for malignancy.
IMPRESSION: Further evaluation is suggested for calcifications in the right
breast.

RECOMMENDATION:
Diagnostic mammogram of the right breast. (Code:[id number]-G-XXL)

The patient will be contacted regarding the findings, and additional
imaging will be scheduled.

BI-RADS CATEGORY  0: Incomplete. Need additional imaging evaluation
and/or prior mammograms for comparison.

## 2022-10-24 DIAGNOSIS — E782 Mixed hyperlipidemia: Secondary | ICD-10-CM | POA: Diagnosis not present

## 2022-10-24 DIAGNOSIS — I10 Essential (primary) hypertension: Secondary | ICD-10-CM | POA: Diagnosis not present

## 2022-10-24 DIAGNOSIS — Z8639 Personal history of other endocrine, nutritional and metabolic disease: Secondary | ICD-10-CM | POA: Diagnosis not present

## 2023-01-21 DIAGNOSIS — E782 Mixed hyperlipidemia: Secondary | ICD-10-CM | POA: Diagnosis not present

## 2023-01-21 DIAGNOSIS — E559 Vitamin D deficiency, unspecified: Secondary | ICD-10-CM | POA: Diagnosis not present

## 2023-01-21 DIAGNOSIS — E1165 Type 2 diabetes mellitus with hyperglycemia: Secondary | ICD-10-CM | POA: Diagnosis not present

## 2023-01-29 DIAGNOSIS — E782 Mixed hyperlipidemia: Secondary | ICD-10-CM | POA: Diagnosis not present

## 2023-01-29 DIAGNOSIS — I10 Essential (primary) hypertension: Secondary | ICD-10-CM | POA: Diagnosis not present

## 2023-01-29 DIAGNOSIS — E663 Overweight: Secondary | ICD-10-CM | POA: Diagnosis not present

## 2023-01-29 DIAGNOSIS — Z23 Encounter for immunization: Secondary | ICD-10-CM | POA: Diagnosis not present

## 2023-01-29 DIAGNOSIS — E559 Vitamin D deficiency, unspecified: Secondary | ICD-10-CM | POA: Diagnosis not present

## 2023-03-02 ENCOUNTER — Emergency Department (HOSPITAL_BASED_OUTPATIENT_CLINIC_OR_DEPARTMENT_OTHER)
Admission: EM | Admit: 2023-03-02 | Discharge: 2023-03-02 | Disposition: A | Discharge status: Home / Self Care | Payer: Medicare HMO | Attending: Emergency Medicine | Admitting: Emergency Medicine

## 2023-03-02 ENCOUNTER — Other Ambulatory Visit: Payer: Self-pay

## 2023-03-02 ENCOUNTER — Emergency Department (HOSPITAL_BASED_OUTPATIENT_CLINIC_OR_DEPARTMENT_OTHER): Payer: Medicare HMO

## 2023-03-02 ENCOUNTER — Encounter (HOSPITAL_BASED_OUTPATIENT_CLINIC_OR_DEPARTMENT_OTHER): Payer: Self-pay | Admitting: Emergency Medicine

## 2023-03-02 DIAGNOSIS — Y9301 Activity, walking, marching and hiking: Secondary | ICD-10-CM | POA: Insufficient documentation

## 2023-03-02 DIAGNOSIS — S52202A Unspecified fracture of shaft of left ulna, initial encounter for closed fracture: Secondary | ICD-10-CM | POA: Diagnosis not present

## 2023-03-02 DIAGNOSIS — W130XXA Fall from, out of or through balcony, initial encounter: Secondary | ICD-10-CM | POA: Diagnosis not present

## 2023-03-02 DIAGNOSIS — I10 Essential (primary) hypertension: Secondary | ICD-10-CM | POA: Insufficient documentation

## 2023-03-02 DIAGNOSIS — S52502A Unspecified fracture of the lower end of left radius, initial encounter for closed fracture: Secondary | ICD-10-CM | POA: Diagnosis not present

## 2023-03-02 DIAGNOSIS — E119 Type 2 diabetes mellitus without complications: Secondary | ICD-10-CM | POA: Insufficient documentation

## 2023-03-02 DIAGNOSIS — S52251A Displaced comminuted fracture of shaft of ulna, right arm, initial encounter for closed fracture: Secondary | ICD-10-CM | POA: Insufficient documentation

## 2023-03-02 DIAGNOSIS — M79622 Pain in left upper arm: Secondary | ICD-10-CM | POA: Diagnosis present

## 2023-03-02 DIAGNOSIS — S52222A Displaced transverse fracture of shaft of left ulna, initial encounter for closed fracture: Secondary | ICD-10-CM | POA: Diagnosis not present

## 2023-03-02 DIAGNOSIS — S5292XA Unspecified fracture of left forearm, initial encounter for closed fracture: Secondary | ICD-10-CM

## 2023-03-02 DIAGNOSIS — S52252A Displaced comminuted fracture of shaft of ulna, left arm, initial encounter for closed fracture: Secondary | ICD-10-CM | POA: Diagnosis not present

## 2023-03-02 DIAGNOSIS — M25422 Effusion, left elbow: Secondary | ICD-10-CM | POA: Diagnosis not present

## 2023-03-02 DIAGNOSIS — S52602A Unspecified fracture of lower end of left ulna, initial encounter for closed fracture: Secondary | ICD-10-CM | POA: Diagnosis not present

## 2023-03-02 DIAGNOSIS — S52132A Displaced fracture of neck of left radius, initial encounter for closed fracture: Secondary | ICD-10-CM | POA: Diagnosis not present

## 2023-03-02 DIAGNOSIS — S99921A Unspecified injury of right foot, initial encounter: Secondary | ICD-10-CM | POA: Diagnosis not present

## 2023-03-02 HISTORY — DX: Type 2 diabetes mellitus without complications: E11.9

## 2023-03-02 HISTORY — DX: Pure hypercholesterolemia, unspecified: E78.00

## 2023-03-02 MED ORDER — MORPHINE SULFATE (PF) 4 MG/ML IV SOLN
4.0000 mg | Freq: Once | INTRAVENOUS | Status: AC
  Administered 2023-03-02: 4 mg via INTRAMUSCULAR
  Filled 2023-03-02: qty 1

## 2023-03-02 MED ORDER — ONDANSETRON HCL 4 MG/2ML IJ SOLN
4.0000 mg | Freq: Once | INTRAMUSCULAR | Status: AC
  Administered 2023-03-02: 4 mg via INTRAVENOUS
  Filled 2023-03-02: qty 2

## 2023-03-02 MED ORDER — OXYCODONE-ACETAMINOPHEN 5-325 MG PO TABS: 1.0000 | ORAL_TABLET | Freq: Four times a day (QID) | ORAL | 0 refills | Status: DC | PRN

## 2023-03-02 MED ORDER — HYDROMORPHONE HCL 1 MG/ML IJ SOLN
1.0000 mg | Freq: Once | INTRAMUSCULAR | Status: AC
  Administered 2023-03-02: 1 mg via INTRAVENOUS
  Filled 2023-03-02: qty 1

## 2023-03-02 NOTE — ED Notes (Signed)
Discharge paperwork given and verbally understood. 

## 2023-03-02 NOTE — Discharge Instructions (Addendum)
You have fracture both your ulna and radius in your left arm. Please keep the splint on until you're evaluated by orthopedics. Prescription for Percocet provided. Take every 6 hours as needed for pain. You can also alternate between Ibuprofen and Tylenol every 4 hours as needed.   Percocet is a narcotic medication. It can cause constipation and drowsiness. Do not take this medication if you have to drive or operate machinery.  Orthopedic surgeon, Dr. Eulah Pont will see you tomorrow at 8:30 AM for further evaluation. Information of his office location and phone number provided above.  Please return to the ED if: you develop numbness or tingling to your hand, or if you develop coldness to your extremity.

## 2023-03-02 NOTE — ED Triage Notes (Signed)
Fell through rotted deck this am. Her left leg went through and left arm caught on the deck and her right foot twisted. Left arm is in sling, very painful.pt able to wiggle her fingers on left side and positive pulses. Pt also reports right great toe is sore/painful

## 2023-03-02 NOTE — ED Notes (Signed)
Pt placed on 2lpm due to SpO2 falling to high 80's after being given Dilaudid.Marland KitchenMarland Kitchen

## 2023-03-02 NOTE — ED Provider Notes (Signed)
[place] EMERGENCY DEPARTMENT AT [facility] Provider Note   CSN: [id number] Arrival date & time: 03/02/23  1045     History  No chief complaint on file.   Christine Lopez is a 67 y.o. female with a history of hypertension and diabetes who presents to the ED today for left arm injury after a fall.  Patient reports that she was walking on her deck this morning when the wood broke beneath her and she fell straight down through the deck. She denies hitting her head or LOC. She is not on blood thinners. She reports pain to her right big toe with ambulation as well as pain and swelling to her left elbow.  Patient has a sling at home which she is using to support her left elbow as it is painful to move.  She took an Oxycodone tablet that she had at home prior to arrival in the ED but denies any relief of pain.  No other complaints or concerns at this time    Home Medications Prior to Admission medications   Medication Sig Start Date End Date Taking? Authorizing Provider  oxyCODONE-acetaminophen (PERCOCET/ROXICET) 5-325 MG tablet Take 1 tablet by mouth every 6 (six) hours as needed for up to 5 days for severe pain. 03/02/23 03/07/23 Yes Maxwell Marion, PA-C  methocarbamol (ROBAXIN) 500 MG tablet Take 1 tablet (500 mg total) by mouth 2 (two) times daily. 05/07/15   Muthersbaugh, Dahlia Client, PA-C  oxyCODONE-acetaminophen (PERCOCET) 5-325 MG per tablet Take 1-2 tablets by mouth every 8 (eight) hours as needed. 05/07/15   Muthersbaugh, Dahlia Client, PA-C  predniSONE (DELTASONE) 20 MG tablet 3 tabs po daily x 3 days, then 2 tabs x 3 days, then 1.5 tabs x 3 days, then 1 tab x 3 days, then 0.5 tabs x 3 days 05/07/15   Muthersbaugh, Dahlia Client, PA-C      Allergies    Patient has no known allergies.    Review of Systems   Review of Systems  Musculoskeletal:        Left elbow pain and deformity. Right great toe pain with ambulation.  All other systems reviewed and are negative.   Physical Exam Updated Vital  Signs BP 130/83 (BP Location: Right Arm)   Pulse 91   Temp 97.7 F (36.5 C) (Oral)   Resp 20   SpO2 100%  Physical Exam Vitals and nursing note reviewed.  Constitutional:      Appearance: Normal appearance.  HENT:     Head: Normocephalic and atraumatic.     Mouth/Throat:     Mouth: Mucous membranes are moist.  Eyes:     Conjunctiva/sclera: Conjunctivae normal.     Pupils: Pupils are equal, round, and reactive to light.  Cardiovascular:     Rate and Rhythm: Normal rate and regular rhythm.     Pulses: Normal pulses.  Pulmonary:     Effort: Pulmonary effort is normal.     Breath sounds: Normal breath sounds.  Abdominal:     Palpations: Abdomen is soft.     Tenderness: There is no abdominal tenderness.  Musculoskeletal:        General: Swelling, tenderness and deformity present.     Comments: Lateral left elbow tenderness and edema without ecchymosis. Painful range of motion of left elbow appreciated on exam. No pain or tenderness to left shoulder, wrist, or hand. Left radial pulse 2+. Capillary refill within 2 seconds of her digits.  No tenderness to palpation of right great toe. No edema or  ecchymosis present either.  Skin:    General: Skin is warm and dry.     Capillary Refill: Capillary refill takes less than 2 seconds.     Findings: No erythema, lesion or rash.  Neurological:     General: No focal deficit present.     Mental Status: She is alert.     Comments: No numbness, tingling, decreased sensation to left upper extremity.  Psychiatric:        Mood and Affect: Mood normal.        Behavior: Behavior normal.     ED Results / Procedures / Treatments   Labs (all labs ordered are listed, but only abnormal results are displayed) Labs Reviewed - No data to display  EKG None  Radiology DG Elbow Complete Left  Result Date: 03/02/2023 CLINICAL DATA:  Patient fell through rotted deck this morning. Left arm caught on the deck. EXAM: LEFT ELBOW - COMPLETE 3+ VIEW  COMPARISON:  None Available. FINDINGS: Acute, mildly displaced, comminuted fracture of the proximal ulnar shaft. There is dorsal apex angulation. The olecranon appears appropriately positioned within the ulnohumeral joint. Acute severely displaced transverse fracture of the radial neck. The radial head is superiorly displaced with the articular surface rotated in the dorsal direction. Elbow joint effusion. Soft tissue swelling overlies the dorsal aspect of the left proximal forearm. IMPRESSION: 1. Acute, mildly displaced, comminuted fracture of the proximal ulnar shaft with dorsal apex angulation. 2. Acute, severely displaced, transverse fracture of the radial neck. The radial head is superiorly displaced with the articular surface rotated in the dorsal direction. 3. Large elbow joint effusion. Electronically Signed   By: Sherron Ales M.D.   On: 03/02/2023 12:15   DG Foot Complete Right  Result Date: 03/02/2023 CLINICAL DATA:  Patient fell through rotted deck morning. Left leg went through and left arm caught on the deck with twisting of the right foot. Patient notes that the right great toe is sore/painful. EXAM: RIGHT FOOT COMPLETE - 3+ VIEW COMPARISON:  None Available. FINDINGS: There is no evidence of fracture. The great toe proximal phalanx is subluxed superiorly relative to the head of the first metatarsal, best appreciated on lateral view, with mild joint space narrowing. There is no evidence of significant arthropathy or other focal bone abnormality. Small plantar calcaneal enthesophyte. Soft tissues are unremarkable. IMPRESSION: 1. No evidence of fracture. 2. Superior subluxation of the great toe proximal phalanx with mild joint space narrowing. Electronically Signed   By: Sherron Ales M.D.   On: 03/02/2023 12:08    Procedures Procedures    Medications Ordered in ED Medications  morphine (PF) 4 MG/ML injection 4 mg (4 mg Intramuscular Given 03/02/23 1151)  HYDROmorphone (DILAUDID) injection 1 mg  (1 mg Intravenous Given 03/02/23 1256)  ondansetron (ZOFRAN) injection 4 mg (4 mg Intravenous Given 03/02/23 1257)    ED Course/ Medical Decision Making/ A&P                             Medical Decision Making Amount and/or Complexity of Data Reviewed Radiology: ordered.  Risk Prescription drug management.   Patient is a 67 year old female with history of diabetes and hypertension who presents to the ED today after falling through her deck.  She reports that she fell straight down and did not hit her head or lose consciousness.  She reports pain to her right great toe with ambulation as well as pain and swelling to her left  elbow.  Patient reports she is able to move her elbow but it is very painful.  No pain or decreased range of motion to left shoulder, wrist, or hands.  She took an extra tablet of oxycodone she had at home from a prior surgery but she denies any plain relief at this.  My differentials include: Contusion versus dislocation versus fracture of the ulna, radius, and/or humerus.  Social determinants of health include: Housing, social connection, physical activity.  On physical exam she had swelling to the left lateral antecubital area.  She maintains sensation, no tingling or numbness.  2+ bounding left radial pulse.  Full range of motion of her left shoulder wrist and hand were appreciated.  Range of motion was appreciated of the left elbow the limited second to pain.  Patient denies tenderness to palpation of her feet or toes.  Regular rate and rhythm noted on cardiac auscultation.  Lungs clear to auscultation bilaterally.  Left elbow x-ray and right foot x-ray were ordered.  Right foot x-ray was unremarkable.  Left elbow x-ray showed acute mildly displaced comminuted fracture of the proximal ulnar shaft with dorsal apex angulation as well as severely displaced transverse fracture of the radial neck.  The radial head is superiorly displaced with the articular surface rotated in the  dorsal direction.  There is a large elbow joint effusion present as well.  IM morphine was given for pain.  Pain returned within 30 minutes of administration of medication. IV Dilaudid was given as well as Zofran.  Nurse reported that patient's oxygen level dropped some so she was placed on nasal cannula. She is feeling better and in no distress.    Discussed results with patient.  She was placed in posterior long-arm splint and her sling was put back on to help immobilize the left arm until she is able to be evaluated by an orthopedic specialist. Prescription for several days worth of Percocet will be given.  Dr. Eulah Pont was consulted and will see patient tomorrow morning at 8:30 AM for evaluation.  Patient reports that she has seen Dr. Ave Filter, an orthopedic specialist, in the past who had performed surgery on her right elbow.  She reports that she will call his office first thing in the morning to see if she can get an appointment with him.  If she cannot she will see Dr. Eulah Pont at 8:30 AM Monday morning.  Patient is stable and safe for discharge home.  Strict return precautions given.       Final Clinical Impression(s) / ED Diagnoses Final diagnoses:  Radius/ulna fracture, left, closed, initial encounter    Rx / DC Orders ED Discharge Orders          Ordered    oxyCODONE-acetaminophen (PERCOCET/ROXICET) 5-325 MG tablet  Every 6 hours PRN        03/02/23 1401              Maxwell Marion, PA-C 03/03/23 1351    Derwood Kaplan, MD 03/09/23 1442

## 2023-03-03 ENCOUNTER — Other Ambulatory Visit: Payer: Self-pay | Admitting: Orthopedic Surgery

## 2023-03-03 DIAGNOSIS — M25822 Other specified joint disorders, left elbow: Secondary | ICD-10-CM

## 2023-03-03 DIAGNOSIS — M79671 Pain in right foot: Secondary | ICD-10-CM | POA: Diagnosis not present

## 2023-03-03 NOTE — Patient Instructions (Addendum)
SURGICAL WAITING ROOM VISITATION  Patients having surgery or a procedure may have no more than 2 support people in the waiting area - these visitors may rotate.    Children under the age of 56 must have an adult with them who is not the patient.   If the patient needs to stay at the hospital during part of their recovery, the visitor guidelines for inpatient rooms apply. Pre-op nurse will coordinate an appropriate time for 1 support person to accompany patient in pre-op.  This support person may not rotate.    Please refer to the [facility] website for the visitor guidelines for Inpatients (after your surgery is over and you are in a regular room).       Your procedure is scheduled on: 03-05-23   Report to [facility] Main Entrance    Report to admitting at      1145  AM   Call this number if you have problems the morning of surgery [phone]   Do not eat food :After Midnight.   After Midnight you may have the following liquids until _11:00 _____ AM DAY OF SURGERY  Water Non-Citrus Juices (without pulp, NO RED-Apple, White grape, White cranberry) Black Coffee (NO MILK/CREAM OR CREAMERS, sugar ok)  Clear Tea (NO MILK/CREAM OR CREAMERS, sugar ok) regular and decaf                             Plain Jell-O (NO RED)                                           Fruit ices (not with fruit pulp, NO RED)                                     Popsicles (NO RED)                                                               Sports drinks like Gatorade (NO RED)                   The day of surgery:  Drink ONE (1) Pre-Surgery G2 at     11:00  AM the morning of surgery. Drink in one sitting. Do not sip.  This drink was given to you during your hospital  pre-op appointment visit. Nothing else to drink after completing the  Pre-Surgery or G2.          If you have questions, please contact your surgeon's office.   FOLLOW ANY ADDITIONAL PRE OP INSTRUCTIONS YOU RECEIVED FROM YOUR  SURGEON'S OFFICE!!!     Oral Hygiene is also important to reduce your risk of infection.                                    Remember - BRUSH YOUR TEETH THE MORNING OF SURGERY WITH YOUR REGULAR TOOTHPASTE  DENTURES WILL BE REMOVED PRIOR TO SURGERY PLEASE DO NOT APPLY "Poly grip" OR  ADHESIVES!!!   Do NOT smoke after Midnight   Take these medicines the morning of surgery with A SIP OF WATER: NONE                                You may not have any metal on your body including hair pins, jewelry, and body piercing             Do not wear make-up, lotions, powders, perfumes/cologne, or deodorant  Do not wear nail polish including gel and S&S, artificial/acrylic nails, or any other type of covering on natural nails including finger and toenails. If you have artificial nails, gel coating, etc. that needs to be removed by a nail salon please have this removed prior to surgery or surgery may need to be canceled/ delayed if the surgeon/ anesthesia feels like they are unable to be safely monitored.   Do not shave  48 hours prior to surgery.              Do not bring valuables to the hospital. [facility] IS NOT             RESPONSIBLE   FOR VALUABLES.   Contacts, glasses, dentures or bridgework may not be worn into surgery.   Bring small overnight bag day of surgery.   DO NOT BRING YOUR HOME MEDICATIONS TO THE HOSPITAL. PHARMACY WILL DISPENSE MEDICATIONS LISTED ON YOUR MEDICATION LIST TO YOU DURING YOUR ADMISSION IN THE HOSPITAL!    Patients discharged on the day of surgery will not be allowed to drive home.  Someone NEEDS to stay with you for the first 24 hours after anesthesia.   Special Instructions: Bring a copy of your healthcare power of attorney and living will documents the day of surgery if you haven't scanned them before.              Please read over the following fact sheets you were given: IF YOU HAVE QUESTIONS ABOUT YOUR PRE-OP INSTRUCTIONS PLEASE CALL [phone]    If  you test positive for Covid or have been in contact with anyone that has tested positive in the last 10 days please notify you surgeon.    [facility] - Preparing for Surgery Before surgery, you can play an important role.  Because skin is not sterile, your skin needs to be as free of germs as possible.  You can reduce the number of germs on your skin by washing with CHG (chlorahexidine gluconate) soap before surgery.  CHG is an antiseptic cleaner which kills germs and bonds with the skin to continue killing germs even after washing. Please DO NOT use if you have an allergy to CHG or antibacterial soaps.  If your skin becomes reddened/irritated stop using the CHG and inform your nurse when you arrive at Short Stay. Do not shave (including legs and underarms) for at least 48 hours prior to the first CHG shower.  You may shave your face/neck. Please follow these instructions carefully:  1.  Shower with CHG Soap the night before surgery and the  morning of Surgery.  2.  If you choose to wash your hair, wash your hair first as usual with your  normal  shampoo.  3.  After you shampoo, rinse your hair and body thoroughly to remove the  shampoo.  4.  Use CHG as you would any other liquid soap.  You can apply chg directly  to the skin and wash                       Gently with a scrungie or clean washcloth.  5.  Apply the CHG Soap to your body ONLY FROM THE NECK DOWN.   Do not use on face/ open                           Wound or open sores. Avoid contact with eyes, ears mouth and genitals (private parts).                       Wash face,  Genitals (private parts) with your normal soap.             6.  Wash thoroughly, paying special attention to the area where your surgery  will be performed.  7.  Thoroughly rinse your body with warm water from the neck down.  8.  DO NOT shower/wash with your normal soap after using and rinsing off  the CHG Soap.                9.  Pat yourself dry  with a clean towel.            10.  Wear clean pajamas.            11.  Place clean sheets on your bed the night of your first shower and do not  sleep with pets. Day of Surgery : Do not apply any lotions/deodorants the morning of surgery.  Please wear clean clothes to the hospital/surgery center.  FAILURE TO FOLLOW THESE INSTRUCTIONS MAY RESULT IN THE CANCELLATION OF YOUR SURGERY PATIENT SIGNATURE_________________________________  NURSE SIGNATURE__________________________________  ________________________________________________________________________

## 2023-03-03 NOTE — Progress Notes (Addendum)
PCP - LOV 10-24-22 Maryelizabeth Rowan Cardiologist - no  PPM/ICD -  Device Orders -  Rep Notified -   Chest x-ray -  EKG -  Stress Test -  ECHO -  Cardiac Cath -   Sleep Study -  CPAP -   Fasting Blood Sugar -  Checks Blood Sugar _____ times a day  Blood Thinner Instructions: Aspirin Instructions:  ERAS Protcol - PRE-SURGERY G2-    COVID vaccine -yes  Activity--Able to climb a flight of stairs without CP or SOB Anesthesia review: HTN DM   Patient denies shortness of breath, fever, cough and chest pain at PAT appointment   All instructions explained to the patient, with a verbal understanding of the material. Patient agrees to go over the instructions while at home for a better understanding. Patient also instructed to self quarantine after being tested for COVID-19. The opportunity to ask questions was provided.

## 2023-03-04 ENCOUNTER — Encounter (HOSPITAL_COMMUNITY): Payer: Self-pay

## 2023-03-04 ENCOUNTER — Ambulatory Visit
Admission: RE | Admit: 2023-03-04 | Discharge: 2023-03-04 | Disposition: A | Discharge status: Home / Self Care | Payer: Medicare HMO | Source: Ambulatory Visit | Attending: Orthopedic Surgery | Admitting: Orthopedic Surgery

## 2023-03-04 ENCOUNTER — Encounter (HOSPITAL_COMMUNITY)
Admission: RE | Admit: 2023-03-04 | Discharge: 2023-03-04 | Disposition: A | Discharge status: Home / Self Care | Payer: Medicare HMO | Source: Ambulatory Visit | Attending: Orthopedic Surgery | Admitting: Orthopedic Surgery

## 2023-03-04 ENCOUNTER — Other Ambulatory Visit: Payer: Self-pay

## 2023-03-04 VITALS — BP 142/69 | HR 116 | Temp 98.3°F | Resp 17 | Ht 62.0 in | Wt 164.0 lb

## 2023-03-04 DIAGNOSIS — Z01818 Encounter for other preprocedural examination: Secondary | ICD-10-CM | POA: Insufficient documentation

## 2023-03-04 DIAGNOSIS — I10 Essential (primary) hypertension: Secondary | ICD-10-CM | POA: Diagnosis not present

## 2023-03-04 DIAGNOSIS — E119 Type 2 diabetes mellitus without complications: Secondary | ICD-10-CM | POA: Diagnosis not present

## 2023-03-04 DIAGNOSIS — S42352A Displaced comminuted fracture of shaft of humerus, left arm, initial encounter for closed fracture: Secondary | ICD-10-CM | POA: Diagnosis not present

## 2023-03-04 DIAGNOSIS — M25822 Other specified joint disorders, left elbow: Secondary | ICD-10-CM

## 2023-03-04 DIAGNOSIS — R000 Tachycardia, unspecified: Secondary | ICD-10-CM | POA: Diagnosis not present

## 2023-03-04 DIAGNOSIS — S52122A Displaced fracture of head of left radius, initial encounter for closed fracture: Secondary | ICD-10-CM | POA: Diagnosis not present

## 2023-03-04 DIAGNOSIS — S52045A Nondisplaced fracture of coronoid process of left ulna, initial encounter for closed fracture: Secondary | ICD-10-CM | POA: Diagnosis not present

## 2023-03-04 HISTORY — DX: Unspecified osteoarthritis, unspecified site: M19.90

## 2023-03-04 LAB — BASIC METABOLIC PANEL
Anion gap: 11 (ref 5–15)
BUN: 19 mg/dL (ref 8–23)
CO2: 25 mmol/L (ref 22–32)
Calcium: 9.7 mg/dL (ref 8.9–10.3)
Chloride: 101 mmol/L (ref 98–111)
Creatinine, Ser: 0.93 mg/dL (ref 0.44–1.00)
GFR, Estimated: >60 mL/min (ref >60)
Glucose, Bld: 135 mg/dL — ABNORMAL HIGH (ref 70–99)
Potassium: 3.6 mmol/L (ref 3.5–5.1)
Sodium: 137 mmol/L (ref 135–145)

## 2023-03-04 LAB — CBC
HCT: 37.8 % (ref 36.0–46.0)
Hemoglobin: 12 g/dL (ref 12.0–15.0)
MCH: 27.5 pg (ref 26.0–34.0)
MCHC: 31.7 g/dL (ref 30.0–36.0)
MCV: 86.5 fL (ref 80.0–100.0)
Platelets: 279 10*3/uL (ref 150–400)
RBC: 4.37 MIL/uL (ref 3.87–5.11)
RDW: 13.3 % (ref 11.5–15.5)
WBC: 10 10*3/uL (ref 4.0–10.5)
nRBC: 0 % (ref 0.0–0.2)

## 2023-03-05 ENCOUNTER — Ambulatory Visit (HOSPITAL_COMMUNITY): Payer: Medicare HMO | Admitting: Anesthesiology

## 2023-03-05 ENCOUNTER — Ambulatory Visit (HOSPITAL_COMMUNITY)
Admission: RE | Admit: 2023-03-05 | Discharge: 2023-03-05 | Disposition: A | Discharge status: Home / Self Care | Payer: Medicare HMO | Attending: Orthopedic Surgery | Admitting: Orthopedic Surgery

## 2023-03-05 ENCOUNTER — Encounter (HOSPITAL_COMMUNITY)
Admission: RE | Disposition: A | Discharge status: Home / Self Care | Payer: Self-pay | Source: Home / Self Care | Attending: Orthopedic Surgery

## 2023-03-05 ENCOUNTER — Encounter (HOSPITAL_COMMUNITY): Payer: Self-pay | Admitting: Orthopedic Surgery

## 2023-03-05 ENCOUNTER — Other Ambulatory Visit: Payer: Self-pay

## 2023-03-05 DIAGNOSIS — S53402A Unspecified sprain of left elbow, initial encounter: Secondary | ICD-10-CM | POA: Insufficient documentation

## 2023-03-05 DIAGNOSIS — I10 Essential (primary) hypertension: Secondary | ICD-10-CM

## 2023-03-05 DIAGNOSIS — Z79899 Other long term (current) drug therapy: Secondary | ICD-10-CM | POA: Insufficient documentation

## 2023-03-05 DIAGNOSIS — W19XXXA Unspecified fall, initial encounter: Secondary | ICD-10-CM | POA: Insufficient documentation

## 2023-03-05 DIAGNOSIS — S52022A Displaced fracture of olecranon process without intraarticular extension of left ulna, initial encounter for closed fracture: Secondary | ICD-10-CM | POA: Diagnosis not present

## 2023-03-05 DIAGNOSIS — M1990 Unspecified osteoarthritis, unspecified site: Secondary | ICD-10-CM | POA: Diagnosis not present

## 2023-03-05 DIAGNOSIS — E119 Type 2 diabetes mellitus without complications: Secondary | ICD-10-CM

## 2023-03-05 DIAGNOSIS — S52002A Unspecified fracture of upper end of left ulna, initial encounter for closed fracture: Secondary | ICD-10-CM | POA: Insufficient documentation

## 2023-03-05 DIAGNOSIS — Z87891 Personal history of nicotine dependence: Secondary | ICD-10-CM | POA: Diagnosis not present

## 2023-03-05 DIAGNOSIS — S52132A Displaced fracture of neck of left radius, initial encounter for closed fracture: Secondary | ICD-10-CM

## 2023-03-05 DIAGNOSIS — M24222 Disorder of ligament, left elbow: Secondary | ICD-10-CM | POA: Diagnosis not present

## 2023-03-05 HISTORY — PX: ORIF ELBOW FRACTURE: SHX5031

## 2023-03-05 LAB — HEMOGLOBIN A1C
Hgb A1c MFr Bld: 6 % — ABNORMAL HIGH (ref 4.8–5.6)
Mean Plasma Glucose: 126 mg/dL

## 2023-03-05 LAB — GLUCOSE, CAPILLARY
Glucose-Capillary: 178 mg/dL — ABNORMAL HIGH (ref 70–99)
Glucose-Capillary: 134 mg/dL — ABNORMAL HIGH (ref 70–99)
Glucose-Capillary: 108 mg/dL — ABNORMAL HIGH (ref 70–99)

## 2023-03-05 SURGERY — OPEN REDUCTION INTERNAL FIXATION (ORIF) ELBOW/OLECRANON FRACTURE
Anesthesia: Monitor Anesthesia Care | Site: Elbow | Laterality: Left

## 2023-03-05 MED ORDER — 0.9 % SODIUM CHLORIDE (POUR BTL) OPTIME
TOPICAL | Status: DC | PRN
  Administered 2023-03-05: 1000 mL

## 2023-03-05 MED ORDER — DEXAMETHASONE SODIUM PHOSPHATE 10 MG/ML IJ SOLN
INTRAMUSCULAR | Status: DC | PRN
  Administered 2023-03-05: 10 mg via INTRAVENOUS

## 2023-03-05 MED ORDER — METHOCARBAMOL 500 MG PO TABS: 500.0000 mg | ORAL_TABLET | Freq: Four times a day (QID) | ORAL | 0 refills | Status: AC | PRN

## 2023-03-05 MED ORDER — MELOXICAM 15 MG PO TABS: 15.0000 mg | ORAL_TABLET | Freq: Every day | ORAL | 0 refills | Status: AC

## 2023-03-05 MED ORDER — LACTATED RINGERS IV SOLN: INTRAVENOUS | Status: DC

## 2023-03-05 MED ORDER — CEFAZOLIN SODIUM-DEXTROSE 2-4 GM/100ML-% IV SOLN
2.0000 g | INTRAVENOUS | Status: AC
  Administered 2023-03-05: 2 g via INTRAVENOUS
  Filled 2023-03-05: qty 100

## 2023-03-05 MED ORDER — FENTANYL CITRATE PF 50 MCG/ML IJ SOSY: 25.0000 ug | PREFILLED_SYRINGE | INTRAMUSCULAR | Status: DC | PRN

## 2023-03-05 MED ORDER — OXYCODONE HCL 5 MG PO TABS: 5.0000 mg | ORAL_TABLET | ORAL | 0 refills | Status: AC | PRN

## 2023-03-05 MED ORDER — OXYCODONE HCL 5 MG/5ML PO SOLN: 5.0000 mg | Freq: Once | ORAL | Status: DC | PRN

## 2023-03-05 MED ORDER — PHENYLEPHRINE HCL (PRESSORS) 10 MG/ML IV SOLN
INTRAVENOUS | Status: DC | PRN
  Administered 2023-03-05 (×3): 160 ug via INTRAVENOUS

## 2023-03-05 MED ORDER — OXYCODONE HCL 5 MG PO TABS: 5.0000 mg | ORAL_TABLET | Freq: Once | ORAL | Status: DC | PRN

## 2023-03-05 MED ORDER — ROPIVACAINE HCL 5 MG/ML IJ SOLN
INTRAMUSCULAR | Status: DC | PRN
  Administered 2023-03-05: 25 mL via PERINEURAL

## 2023-03-05 MED ORDER — PHENYLEPHRINE HCL-NACL 20-0.9 MG/250ML-% IV SOLN
INTRAVENOUS | Status: DC | PRN
  Administered 2023-03-05: 30 ug/min via INTRAVENOUS

## 2023-03-05 MED ORDER — ACETAMINOPHEN 500 MG PO TABS
1000.0000 mg | ORAL_TABLET | Freq: Once | ORAL | Status: AC
  Administered 2023-03-05: 1000 mg via ORAL
  Filled 2023-03-05: qty 2

## 2023-03-05 MED ORDER — FENTANYL CITRATE PF 50 MCG/ML IJ SOSY
100.0000 ug | PREFILLED_SYRINGE | Freq: Once | INTRAMUSCULAR | Status: AC
  Administered 2023-03-05: 100 ug via INTRAVENOUS
  Filled 2023-03-05: qty 2

## 2023-03-05 MED ORDER — MIDAZOLAM HCL 2 MG/2ML IJ SOLN
2.0000 mg | Freq: Once | INTRAMUSCULAR | Status: AC
  Administered 2023-03-05: 2 mg via INTRAVENOUS
  Filled 2023-03-05: qty 2

## 2023-03-05 MED ORDER — AMISULPRIDE (ANTIEMETIC) 5 MG/2ML IV SOLN: 10.0000 mg | Freq: Once | INTRAVENOUS | Status: DC | PRN

## 2023-03-05 MED ORDER — ONDANSETRON HCL 4 MG/2ML IJ SOLN
INTRAMUSCULAR | Status: DC | PRN
  Administered 2023-03-05: 4 mg via INTRAVENOUS

## 2023-03-05 MED ORDER — LIDOCAINE HCL (CARDIAC) PF 100 MG/5ML IV SOSY
PREFILLED_SYRINGE | INTRAVENOUS | Status: DC | PRN
  Administered 2023-03-05: 50 mg via INTRAVENOUS

## 2023-03-05 MED ORDER — ORAL CARE MOUTH RINSE: 15.0000 mL | Freq: Once | OROMUCOSAL | Status: AC

## 2023-03-05 MED ORDER — ONDANSETRON HCL 4 MG/2ML IJ SOLN: 4.0000 mg | Freq: Once | INTRAMUSCULAR | Status: DC | PRN

## 2023-03-05 MED ORDER — LACTATED RINGERS IV SOLN: INTRAVENOUS | Status: DC | PRN

## 2023-03-05 MED ORDER — CHLORHEXIDINE GLUCONATE 0.12 % MT SOLN
15.0000 mL | Freq: Once | OROMUCOSAL | Status: AC
  Administered 2023-03-05: 15 mL via OROMUCOSAL

## 2023-03-05 MED ORDER — PROPOFOL 10 MG/ML IV BOLUS
INTRAVENOUS | Status: DC | PRN
  Administered 2023-03-05: 200 mg via INTRAVENOUS

## 2023-03-05 MED ORDER — STERILE WATER FOR IRRIGATION IR SOLN
Status: DC | PRN
  Administered 2023-03-05: 2000 mL

## 2023-03-05 MED ORDER — EPHEDRINE SULFATE (PRESSORS) 50 MG/ML IJ SOLN
INTRAMUSCULAR | Status: DC | PRN
  Administered 2023-03-05: 5 mg via INTRAVENOUS

## 2023-03-05 SURGICAL SUPPLY — 69 items
ANCH SUT 2 SHRT 1.45 DRLBT (Anchor) ×1 IMPLANT
ANCHOR JUGGERKNOT W/DRL 2/1.45 (Anchor) IMPLANT
BAG COUNTER SPONGE SURGICOUNT (BAG) IMPLANT
BAG SPNG CNTER NS LX DISP (BAG)
BIT DRILL 2.5X2.75 QC CALB (BIT) IMPLANT
BIT DRILL CALIBRATED 2.7 (BIT) IMPLANT
BLADE OSCILLATING/SAGITTAL (BLADE) ×1
BLADE SURG 15 STRL LF DISP TIS (BLADE) ×2 IMPLANT
BLADE SURG 15 STRL SS (BLADE) ×2
BLADE SW THK.38XMED LNG THN (BLADE) ×1 IMPLANT
BNDG CMPR 5X4 KNIT ELC UNQ LF (GAUZE/BANDAGES/DRESSINGS) ×2
BNDG CMPR 9X4 STRL LF SNTH (GAUZE/BANDAGES/DRESSINGS) ×1
BNDG ELASTIC 4INX 5YD STR LF (GAUZE/BANDAGES/DRESSINGS) ×2 IMPLANT
BNDG ESMARK 4X9 LF (GAUZE/BANDAGES/DRESSINGS) ×1 IMPLANT
COVER SURGICAL LIGHT HANDLE (MISCELLANEOUS) ×1 IMPLANT
CUFF TOURN SGL QUICK 18X4 (TOURNIQUET CUFF) IMPLANT
CUFF TOURN SGL QUICK 24 (TOURNIQUET CUFF)
CUFF TRNQT CYL 24X4X16.5-23 (TOURNIQUET CUFF) IMPLANT
DRAPE C-ARM 42X120 X-RAY (DRAPES) ×1 IMPLANT
DRAPE U-SHAPE 47X51 STRL (DRAPES) ×1 IMPLANT
DRSG EMULSION OIL 3X16 NADH (GAUZE/BANDAGES/DRESSINGS) IMPLANT
DURAPREP 26ML APPLICATOR (WOUND CARE) ×1 IMPLANT
ELECT REM PT RETURN 15FT ADLT (MISCELLANEOUS) ×1 IMPLANT
GAUZE PAD ABD 7.5X8 STRL (GAUZE/BANDAGES/DRESSINGS) IMPLANT
GAUZE SPONGE 4X4 12PLY STRL (GAUZE/BANDAGES/DRESSINGS) ×1 IMPLANT
GLOVE BIO SURGEON STRL SZ7.5 (GLOVE) ×1 IMPLANT
GLOVE BIOGEL PI IND STRL 6.5 (GLOVE) ×1 IMPLANT
GLOVE BIOGEL PI IND STRL 8 (GLOVE) ×1 IMPLANT
GLOVE SURG POLYISO LF SZ6.5 (GLOVE) ×1 IMPLANT
GOWN STRL REUS W/ TWL XL LVL3 (GOWN DISPOSABLE) ×2 IMPLANT
GOWN STRL REUS W/TWL XL LVL3 (GOWN DISPOSABLE) ×2
IMPL HEAD (Orthopedic Implant) IMPLANT
IMPL STEM WITH SCREW 8X29 (Stem) IMPLANT
IMPLANT HEAD (Orthopedic Implant) ×1 IMPLANT
IMPLANT STEM WITH SCREW 8X29 (Stem) ×1 IMPLANT
K-WIRE FIXATION 2.0X6 (WIRE) ×1
KIT BASIN OR (CUSTOM PROCEDURE TRAY) ×1 IMPLANT
KIT TURNOVER KIT A (KITS) IMPLANT
KWIRE FIXATION 2.0X6 (WIRE) IMPLANT
MANIFOLD NEPTUNE II (INSTRUMENTS) ×1 IMPLANT
NDL MAYO 6 CRC TAPER PT (NEEDLE) ×1 IMPLANT
NEEDLE MAYO 6 CRC TAPER PT (NEEDLE) ×1 IMPLANT
PACK ORTHO EXTREMITY (CUSTOM PROCEDURE TRAY) ×1 IMPLANT
PAD CAST 4YDX4 CTTN HI CHSV (CAST SUPPLIES) ×1 IMPLANT
PADDING CAST COTTON 4X4 STRL (CAST SUPPLIES) ×1
PENCIL SMOKE EVACUATOR (MISCELLANEOUS) IMPLANT
PLATE OLECRANON LRG (Plate) IMPLANT
PROTECTOR NERVE ULNAR (MISCELLANEOUS) ×1 IMPLANT
SCREW LOCK CORT STAR 3.5X12 (Screw) IMPLANT
SCREW LOCK CORT STAR 3.5X16 (Screw) IMPLANT
SCREW LOCK CORT STAR 3.5X36 (Screw) IMPLANT
SCREW NON LOCKING LP 3.5 14MM (Screw) IMPLANT
SLING ARM FOAM STRAP MED (SOFTGOODS) IMPLANT
SPLINT PLASTER CAST XFAST 5X30 (CAST SUPPLIES) IMPLANT
SPONGE T-LAP 4X18 ~~LOC~~+RFID (SPONGE) ×1 IMPLANT
STOCKINETTE 8 INCH (MISCELLANEOUS) ×1 IMPLANT
STRIP CLOSURE SKIN 1/2X4 (GAUZE/BANDAGES/DRESSINGS) ×1 IMPLANT
SUCTION TUBE FRAZIER 10FR DISP (SUCTIONS) ×1 IMPLANT
SUT FIBERWIRE #2 38 T-5 BLUE (SUTURE) ×2
SUT MAXBRAID #5 CCS-NDL 2PK (SUTURE) IMPLANT
SUT MNCRL AB 4-0 PS2 18 (SUTURE) ×1 IMPLANT
SUT VIC AB 1 CT1 27 (SUTURE) ×1
SUT VIC AB 1 CT1 27XBRD ANTBC (SUTURE) ×1 IMPLANT
SUT VIC AB 2-0 CT1 27 (SUTURE) ×1
SUT VIC AB 2-0 CT1 TAPERPNT 27 (SUTURE) ×1 IMPLANT
SUTURE FIBERWR #2 38 T-5 BLUE (SUTURE) ×1 IMPLANT
TOWEL OR 17X26 10 PK STRL BLUE (TOWEL DISPOSABLE) ×2 IMPLANT
UNDERPAD 30X36 HEAVY ABSORB (UNDERPADS AND DIAPERS) ×1 IMPLANT
WASHER 3.5MM (Orthopedic Implant) IMPLANT

## 2023-03-05 NOTE — Anesthesia Procedure Notes (Signed)
Procedure Name: LMA Insertion Date/Time: 03/05/2023 2:38 PM  Performed by: Shanon Payor, CRNAPre-anesthesia Checklist: Patient identified, Emergency Drugs available, Suction available, Patient being monitored and Timeout performed Patient Re-evaluated:Patient Re-evaluated prior to induction Oxygen Delivery Method: Circle system utilized Preoxygenation: Pre-oxygenation with 100% oxygen Induction Type: IV induction LMA: LMA inserted LMA Size: 4.0 Number of attempts: 1 Placement Confirmation: CO2 detector and breath sounds checked- equal and bilateral Tube secured with: Tape Dental Injury: Teeth and Oropharynx as per pre-operative assessment

## 2023-03-05 NOTE — H&P (Signed)
Christine Lopez is an 67 y.o. female.   Chief Complaint: L elbow injury  HPI: s/p fall with L elbow fracture/dislocation.  Past Medical History:  Diagnosis Date   Arthritis    Diabetes mellitus without complication (HCC)    ,diet controlled   Hypercholesteremia    Hypertension     Past Surgical History:  Procedure Laterality Date   ABDOMINAL HYSTERECTOMY     FRACTURE SURGERY     right side elbow and arm metal rod in place    Family History  Problem Relation Age of Onset   Breast cancer Paternal Aunt    Social History:  reports that she has quit smoking. Her smoking use included cigarettes. She does not have any smokeless tobacco history on file. She reports that she does not drink alcohol and does not use drugs.  Allergies:  Allergies  Allergen Reactions   Nickel     Blisters     Medications Prior to Admission  Medication Sig Dispense Refill   cetirizine (ZYRTEC) 10 MG tablet Take 10 mg by mouth daily.     Cholecalciferol 125 MCG (5000 UT) TABS Take 1 tablet by mouth daily.     fenofibrate 160 MG tablet Take 80 mg by mouth daily.     Multiple Vitamin (MULTIVITAMIN) tablet Take 1 tablet by mouth daily.     olmesartan-hydrochlorothiazide (BENICAR HCT) 20-12.5 MG tablet Take 1 tablet by mouth daily.     oxyCODONE-acetaminophen (PERCOCET) 5-325 MG per tablet Take 1-2 tablets by mouth every 8 (eight) hours as needed. 21 tablet 0   diclofenac Sodium (VOLTAREN) 1 % GEL Apply 2 g topically 4 (four) times daily as needed (pain).     methocarbamol (ROBAXIN) 500 MG tablet Take 1 tablet (500 mg total) by mouth 2 (two) times daily. (Patient not taking: Reported on 03/04/2023) 20 tablet 0   nystatin cream (MYCOSTATIN) Apply 1 Application topically 2 (two) times daily as needed for dry skin.     oxyCODONE-acetaminophen (PERCOCET/ROXICET) 5-325 MG tablet Take 1 tablet by mouth every 6 (six) hours as needed for up to 5 days for severe pain. (Patient not taking: Reported on 03/04/2023) 15  tablet 0   predniSONE (DELTASONE) 20 MG tablet 3 tabs po daily x 3 days, then 2 tabs x 3 days, then 1.5 tabs x 3 days, then 1 tab x 3 days, then 0.5 tabs x 3 days (Patient not taking: Reported on 03/04/2023) 27 tablet 0   triamcinolone cream (KENALOG) 0.5 % Apply 1 Application topically as needed (dry skin).      Results for orders placed or performed during the hospital encounter of 03/05/23 (from the past 48 hour(s))  Glucose, capillary     Status: Abnormal   Collection Time: 03/05/23 12:07 PM  Result Value Ref Range   Glucose-Capillary 134 (H) 70 - 99 mg/dL    Comment: Glucose reference range applies only to samples taken after fasting for at least 8 hours.   No results found.  Review of Systems  Blood pressure 128/69, pulse (!) 111, temperature 98.2 F (36.8 C), temperature source Oral, resp. rate 13, height 5\' 2"  (1.575 m), weight 74.4 kg, SpO2 97 %. Physical Exam   Assessment/Plan s/p fall with L elbow fracture/dislocation. Plan L elbow ORIF/radial head replacement Risks / benefits of surgery discussed Consent on chart  NPO for OR Preop antibiotics   Glennon Hamilton, MD 03/05/2023, 1:55 PM

## 2023-03-05 NOTE — Anesthesia Procedure Notes (Signed)
Anesthesia Regional Block: Supraclavicular block   Pre-Anesthetic Checklist: , timeout performed,  Correct Patient, Correct Site, Correct Laterality,  Correct Procedure, Correct Position, site marked,  Risks and benefits discussed,  Surgical consent,  Pre-op evaluation,  At surgeon's request and post-op pain management  Laterality: Left  Prep: Maximum Sterile Barrier Precautions used, chloraprep       Needles:  Injection technique: Single-shot  Needle Type: Echogenic Stimulator Needle     Needle Length: 9cm  Needle Gauge: 22     Additional Needles:   Procedures:,,,, ultrasound used (permanent image in chart),,    Narrative:  Start time: 03/05/2023 2:00 PM End time: 03/05/2023 2:05 PM Injection made incrementally with aspirations every 5 mL.  Performed by: Personally  Anesthesiologist: Lannie Fields, DO  Additional Notes: Monitors applied. No increased pain on injection. No increased resistance to injection. Injection made in 5cc increments. Good needle visualization. Patient tolerated procedure well.

## 2023-03-05 NOTE — Transfer of Care (Signed)
Immediate Anesthesia Transfer of Care Note  Patient: Christine Lopez  Procedure(s) Performed: LEFT OPEN REDUCTION INTERNAL FIXATION (ORIF) ELBOW/OLECRANON FRACTURE WITH RADIAL HEAD REPLACEMENT AND LATERAL COLLATERAL LIGAMENT REPAIR (Left: Elbow)  Patient Location: PACU  Anesthesia Type:GA combined with regional for post-op pain  Level of Consciousness: awake, oriented, sedated, and patient cooperative  Airway & Oxygen Therapy: Patient Spontanous Breathing and Patient connected to face mask oxygen  Post-op Assessment: Report given to RN and Post -op Vital signs reviewed and stable  Post vital signs: Reviewed and stable  Last Vitals:  Vitals Value Taken Time  BP 99/61 03/05/23 1640  Temp    Pulse 91 03/05/23 1643  Resp 14 03/05/23 1643  SpO2 96 % 03/05/23 1643  Vitals shown include unvalidated device data.  Last Pain:  Vitals:   03/05/23 1420  TempSrc:   PainSc: Asleep         Complications: No notable events documented.

## 2023-03-05 NOTE — Discharge Instructions (Addendum)
Discharge Instructions after Open Elbow Surgery   A sling may be provided for you. If so, remain in your sling at all times. This includes sleeping in the sling.  Use ice on the elbow intermittently over the first 48 hours after surgery. Pain medicine has been prescribed for you.  Use your medicine liberally over the first 48 hours, and then you can begin to taper your use. You may take Extra Strength Tylenol or Tylenol only in place of the pain pills.  Leave the dressing in place until your follow-up appointment  You may shower after surgery. The dressing CANNOT get wet during your shower. Wrap the dressing with a dry towel and place your arm in a large trash bag.  Take one aspirin a day for 2 weeks after surgery, unless you have an aspirin sensitivity/ allergy or asthma.  Take meloxicam 15mg  daily for 3 weeks    Please call [phone] during normal business hours or [phone] after hours for any problems. Including the following:  - excessive redness of the incisions - drainage for more than 4 days - fever of more than 101.5 F  *Please note that pain medications will not be refilled after hours or on weekends.

## 2023-03-05 NOTE — Anesthesia Preprocedure Evaluation (Addendum)
Anesthesia Evaluation  Patient identified by MRN, date of birth, ID band Patient awake    Reviewed: Allergy & Precautions, NPO status , Patient's Chart, lab work & pertinent test results  Airway Mallampati: II  TM Distance: >3 FB Neck ROM: Full    Dental  (+) Dental Advisory Given, Teeth Intact   Pulmonary former smoker Snores at night, no witnessed apneas, has never had sleep study   Pulmonary exam normal breath sounds clear to auscultation       Cardiovascular hypertension (128/79 preop), Pt. on medications Normal cardiovascular exam Rhythm:Regular Rate:Normal     Neuro/Psych negative neurological ROS  negative psych ROS   GI/Hepatic negative GI ROS, Neg liver ROS,,,  Endo/Other  diabetes, Well Controlled, Type 2    Renal/GU negative Renal ROS  negative genitourinary   Musculoskeletal  (+) Arthritis , Osteoarthritis,    Abdominal   Peds  Hematology negative hematology ROS (+)   Anesthesia Other Findings   Reproductive/Obstetrics negative OB ROS                             Anesthesia Physical Anesthesia Plan  ASA: 2  Anesthesia Plan: Regional and General   Post-op Pain Management: Regional block* and Tylenol PO (pre-op)*   Induction:   PONV Risk Score and Plan: 2 and Midazolam, Dexamethasone, Ondansetron and Treatment may vary due to age or medical condition  Airway Management Planned: LMA  Additional Equipment: None  Intra-op Plan:   Post-operative Plan: Extubation in OR  Informed Consent: I have reviewed the patients History and Physical, chart, labs and discussed the procedure including the risks, benefits and alternatives for the proposed anesthesia with the patient or authorized representative who has indicated his/her understanding and acceptance.     Dental advisory given  Plan Discussed with: CRNA  Anesthesia Plan Comments:        Anesthesia Quick  Evaluation

## 2023-03-05 NOTE — Op Note (Signed)
Procedure(s): LEFT OPEN REDUCTION INTERNAL FIXATION (ORIF) ELBOW/OLECRANON FRACTURE WITH RADIAL HEAD REPLACEMENT AND LATERAL COLLATERAL LIGAMENT REPAIR Procedure Note  Christine Lopez female 67 y.o. 03/05/2023  Preoperative diagnosis: #1 left elbow comminuted proximal ulna fracture #2 left elbow completely displaced radial neck fracture #3 likely disruption of the lateral collateral ligament complex  Postoperative diagnosis: #1 left elbow comminuted proximal ulna fracture #2 left elbow completely displaced radial neck fracture #3 left elbow disruption of the lateral collateral ligament complex  Procedure performed: #1 open reduction internal fixation left comminuted proximal ulna fracture #2 left elbow radial head replacement #3 primary repair of left elbow lateral collateral ligament  Surgeon(s) and Role:    Jones Broom, MD - Primary   Indications:  67 y.o. female s/p fracture dislocation of the left elbow after a fall.  Indicated for surgery to promote anatomic restoration anatomy, improve functional outcome, and restore stability.    Surgeon: Glennon Hamilton   Assistants: Fredia Sorrow PA-C Amber was present and scrubbed throughout the procedure and was essential in positioning, retraction, exposure, and closure)  Anesthesia: General endotracheal anesthesia with preoperative interscalene block given by the attending anesthesiologist    Procedure Detail  LEFT OPEN REDUCTION INTERNAL FIXATION (ORIF) ELBOW/OLECRANON FRACTURE WITH RADIAL HEAD REPLACEMENT AND LATERAL COLLATERAL LIGAMENT REPAIR  Findings: Biomet 22 mm x 10 mm radial head replacement, 8 mm stem, near-anatomic reduction with Biomet long olecranon locking plate and FiberWire cerclage fixation.,  Juggerknot anchor fixation for collateral ligament  Estimated Blood Loss:  less than 50 mL         Drains: none  Blood Given: none         Specimens: none        Complications:  * No complications  entered in OR log *         Disposition: PACU - hemodynamically stable.         Condition: stable    Procedure:   DESCRIPTION OF PROCEDURE: The patient was identified in preoperative  holding area where I personally marked the operative site after  verifying site, side, and procedure with the patient. The patient was taken back  to the operating room where general anesthesia was induced without  complication and was placed in the lateral position on a beanbag with all extremities carefully padded and positioned.  An axillary roll was used.  The head and neck were positioned by anesthesia. The left upper extremity was placed on a sure foot for positioning. A nonsterile tourniquet was applied to the upper arm and the left upper extremity was then prepped and draped in the standard sterile fashion. A approximately the 20 cm incision was made over the posterior aspect of the lower arm and forearm.  Dissection was carried through subcutaneous tissues to the proximal ulna and triceps.  The proximal ulna was exposed including the fracture site.  The radial head was identified laterally and removed.  It was used to size for the radial head implant.  The radial neck was exposed and prepared for radial head replacement.  Trial head replacement was implanted and appropriate size was identified.  At this point the final radial head replacement with the above recorded size was implanted press-fit and reduced. At this point attention was turned to the ulna which was reduced with reduction clamps.  There was medial and lateral comminution but the posterior cortex was intact and could be aligned appropriately to assess length.  The appropriate size plate was laid posteriorly and fixed  proximally and distally.  Fluoroscopic imaging demonstrated anatomic alignment.  The proximal and distal locking screws were placed.  Given the medial and lateral comminution that was not amenable to screw fixation a #2 max braid  suture was placed in a cerclage fashion around the ulna and plate in this area.  This nicely fixed these bony fragments to the construct. At this point attention was turned to the lateral collateral ligament.  The humeral attachment was noted to be detached allowing for potential instability posteriorly of the radial head.  I felt this would benefit from formal repair.  A juggernaut anchor was placed in the proximal ulna at the estimated position of the collateral insertion site.  This was used to repair the distal end of the collateral ligament back to its anatomic origin.  This notably improved stability of the radial head. At this point copious irrigation was used.  Final fluoroscopic imaging demonstrated anatomic reduction of the joint and appropriate positioning of the plate and screws.  Full range of motion was tested and the joint remained stable. The fascia was closed over the distal end of the plate.  Copious irrigation was again used and the skin was closed with 2-0 Vicryl and staples.  A padded sterile dressing was placed and the patient was placed in a long-arm posterior splint at 90 degrees in neutral rotation.  Tourniquet was let down for total tourniquet time of less than 90 minutes at 250 mmHg.  Patient was then allowed to awaken from anesthesia transferred to the stretcher and taken to the recovery room in stable condition.

## 2023-03-05 NOTE — Anesthesia Postprocedure Evaluation (Signed)
Anesthesia Post Note  Patient: Christine Lopez  Procedure(s) Performed: LEFT OPEN REDUCTION INTERNAL FIXATION (ORIF) ELBOW/OLECRANON FRACTURE WITH RADIAL HEAD REPLACEMENT AND LATERAL COLLATERAL LIGAMENT REPAIR (Left: Elbow)     Patient location during evaluation: PACU Anesthesia Type: General Level of consciousness: awake and alert Pain management: pain level controlled Vital Signs Assessment: post-procedure vital signs reviewed and stable Respiratory status: spontaneous breathing, nonlabored ventilation, respiratory function stable and patient connected to nasal cannula oxygen Cardiovascular status: blood pressure returned to baseline and stable Postop Assessment: no apparent nausea or vomiting Anesthetic complications: no  No notable events documented.  Last Vitals:  Vitals:   03/05/23 1725 03/05/23 1730  BP: 123/69 116/71  Pulse: 96 99  Resp:    Temp:    SpO2: 92% 90%    Last Pain:  Vitals:   03/05/23 1725  TempSrc:   PainSc: 0-No pain                 Kennieth Rad

## 2023-03-07 ENCOUNTER — Encounter (HOSPITAL_COMMUNITY): Payer: Self-pay | Admitting: Orthopedic Surgery

## 2023-03-17 DIAGNOSIS — Z9889 Other specified postprocedural states: Secondary | ICD-10-CM | POA: Diagnosis not present

## 2023-04-16 DIAGNOSIS — M79671 Pain in right foot: Secondary | ICD-10-CM | POA: Diagnosis not present

## 2023-04-16 DIAGNOSIS — S52121A Displaced fracture of head of right radius, initial encounter for closed fracture: Secondary | ICD-10-CM | POA: Diagnosis not present

## 2023-04-17 DIAGNOSIS — H2513 Age-related nuclear cataract, bilateral: Secondary | ICD-10-CM | POA: Diagnosis not present

## 2023-04-17 DIAGNOSIS — H524 Presbyopia: Secondary | ICD-10-CM | POA: Diagnosis not present

## 2023-04-17 DIAGNOSIS — E119 Type 2 diabetes mellitus without complications: Secondary | ICD-10-CM | POA: Diagnosis not present

## 2023-04-17 DIAGNOSIS — H40013 Open angle with borderline findings, low risk, bilateral: Secondary | ICD-10-CM | POA: Diagnosis not present

## 2023-04-18 DIAGNOSIS — H40013 Open angle with borderline findings, low risk, bilateral: Secondary | ICD-10-CM | POA: Diagnosis not present

## 2023-04-24 DIAGNOSIS — S52272D Monteggia's fracture of left ulna, subsequent encounter for closed fracture with routine healing: Secondary | ICD-10-CM | POA: Diagnosis not present

## 2023-04-24 DIAGNOSIS — M25622 Stiffness of left elbow, not elsewhere classified: Secondary | ICD-10-CM | POA: Diagnosis not present

## 2023-05-08 DIAGNOSIS — S52272D Monteggia's fracture of left ulna, subsequent encounter for closed fracture with routine healing: Secondary | ICD-10-CM | POA: Diagnosis not present

## 2023-05-08 DIAGNOSIS — M25622 Stiffness of left elbow, not elsewhere classified: Secondary | ICD-10-CM | POA: Diagnosis not present

## 2023-05-13 DIAGNOSIS — M25622 Stiffness of left elbow, not elsewhere classified: Secondary | ICD-10-CM | POA: Diagnosis not present

## 2023-05-13 DIAGNOSIS — S52272D Monteggia's fracture of left ulna, subsequent encounter for closed fracture with routine healing: Secondary | ICD-10-CM | POA: Diagnosis not present

## 2023-05-19 DIAGNOSIS — H0015 Chalazion left lower eyelid: Secondary | ICD-10-CM | POA: Diagnosis not present

## 2023-05-21 DIAGNOSIS — S52272D Monteggia's fracture of left ulna, subsequent encounter for closed fracture with routine healing: Secondary | ICD-10-CM | POA: Diagnosis not present

## 2023-05-21 DIAGNOSIS — M25622 Stiffness of left elbow, not elsewhere classified: Secondary | ICD-10-CM | POA: Diagnosis not present

## 2023-05-23 DIAGNOSIS — I10 Essential (primary) hypertension: Secondary | ICD-10-CM | POA: Diagnosis not present

## 2023-05-23 DIAGNOSIS — K219 Gastro-esophageal reflux disease without esophagitis: Secondary | ICD-10-CM | POA: Diagnosis not present

## 2023-05-23 DIAGNOSIS — Z13220 Encounter for screening for lipoid disorders: Secondary | ICD-10-CM | POA: Diagnosis not present

## 2023-05-23 DIAGNOSIS — Z114 Encounter for screening for human immunodeficiency virus [HIV]: Secondary | ICD-10-CM | POA: Diagnosis not present

## 2023-05-23 DIAGNOSIS — Z0000 Encounter for general adult medical examination without abnormal findings: Secondary | ICD-10-CM | POA: Diagnosis not present

## 2023-05-23 DIAGNOSIS — E663 Overweight: Secondary | ICD-10-CM | POA: Diagnosis not present

## 2023-05-23 DIAGNOSIS — M8580 Other specified disorders of bone density and structure, unspecified site: Secondary | ICD-10-CM | POA: Diagnosis not present

## 2023-05-23 DIAGNOSIS — E782 Mixed hyperlipidemia: Secondary | ICD-10-CM | POA: Diagnosis not present

## 2023-05-23 DIAGNOSIS — E1165 Type 2 diabetes mellitus with hyperglycemia: Secondary | ICD-10-CM | POA: Diagnosis not present

## 2023-05-27 DIAGNOSIS — Z1331 Encounter for screening for depression: Secondary | ICD-10-CM | POA: Diagnosis not present

## 2023-05-27 DIAGNOSIS — Z23 Encounter for immunization: Secondary | ICD-10-CM | POA: Diagnosis not present

## 2023-05-27 DIAGNOSIS — Z0000 Encounter for general adult medical examination without abnormal findings: Secondary | ICD-10-CM | POA: Diagnosis not present

## 2023-05-27 DIAGNOSIS — Z1339 Encounter for screening examination for other mental health and behavioral disorders: Secondary | ICD-10-CM | POA: Diagnosis not present

## 2023-05-28 ENCOUNTER — Other Ambulatory Visit: Payer: Self-pay | Admitting: Family Medicine

## 2023-05-28 DIAGNOSIS — R921 Mammographic calcification found on diagnostic imaging of breast: Secondary | ICD-10-CM

## 2023-05-28 DIAGNOSIS — M25622 Stiffness of left elbow, not elsewhere classified: Secondary | ICD-10-CM | POA: Diagnosis not present

## 2023-05-30 DIAGNOSIS — M25622 Stiffness of left elbow, not elsewhere classified: Secondary | ICD-10-CM | POA: Diagnosis not present

## 2023-05-30 DIAGNOSIS — S52272D Monteggia's fracture of left ulna, subsequent encounter for closed fracture with routine healing: Secondary | ICD-10-CM | POA: Diagnosis not present

## 2023-06-03 DIAGNOSIS — S52272D Monteggia's fracture of left ulna, subsequent encounter for closed fracture with routine healing: Secondary | ICD-10-CM | POA: Diagnosis not present

## 2023-06-03 DIAGNOSIS — M25622 Stiffness of left elbow, not elsewhere classified: Secondary | ICD-10-CM | POA: Diagnosis not present

## 2023-06-10 DIAGNOSIS — M25622 Stiffness of left elbow, not elsewhere classified: Secondary | ICD-10-CM | POA: Diagnosis not present

## 2023-06-10 DIAGNOSIS — S52272D Monteggia's fracture of left ulna, subsequent encounter for closed fracture with routine healing: Secondary | ICD-10-CM | POA: Diagnosis not present

## 2023-06-17 DIAGNOSIS — M25622 Stiffness of left elbow, not elsewhere classified: Secondary | ICD-10-CM | POA: Diagnosis not present

## 2023-06-17 DIAGNOSIS — S52272D Monteggia's fracture of left ulna, subsequent encounter for closed fracture with routine healing: Secondary | ICD-10-CM | POA: Diagnosis not present

## 2023-06-20 ENCOUNTER — Ambulatory Visit
Admission: RE | Admit: 2023-06-20 | Discharge: 2023-06-20 | Disposition: A | Discharge status: Home / Self Care | Payer: Medicare HMO | Source: Ambulatory Visit | Attending: Family Medicine | Admitting: Family Medicine

## 2023-06-20 DIAGNOSIS — R921 Mammographic calcification found on diagnostic imaging of breast: Secondary | ICD-10-CM | POA: Diagnosis not present

## 2023-07-16 DIAGNOSIS — M25622 Stiffness of left elbow, not elsewhere classified: Secondary | ICD-10-CM | POA: Diagnosis not present

## 2023-09-05 DIAGNOSIS — E559 Vitamin D deficiency, unspecified: Secondary | ICD-10-CM | POA: Diagnosis not present

## 2023-09-05 DIAGNOSIS — R5383 Other fatigue: Secondary | ICD-10-CM | POA: Diagnosis not present

## 2023-09-16 DIAGNOSIS — Z789 Other specified health status: Secondary | ICD-10-CM | POA: Diagnosis not present

## 2023-09-16 DIAGNOSIS — E782 Mixed hyperlipidemia: Secondary | ICD-10-CM | POA: Diagnosis not present

## 2023-09-16 DIAGNOSIS — E559 Vitamin D deficiency, unspecified: Secondary | ICD-10-CM | POA: Diagnosis not present

## 2023-09-16 DIAGNOSIS — I10 Essential (primary) hypertension: Secondary | ICD-10-CM | POA: Diagnosis not present

## 2023-09-16 DIAGNOSIS — J309 Allergic rhinitis, unspecified: Secondary | ICD-10-CM | POA: Diagnosis not present

## 2023-09-16 DIAGNOSIS — M19049 Primary osteoarthritis, unspecified hand: Secondary | ICD-10-CM | POA: Diagnosis not present

## 2023-09-16 DIAGNOSIS — Z23 Encounter for immunization: Secondary | ICD-10-CM | POA: Diagnosis not present

## 2023-09-17 DIAGNOSIS — M25522 Pain in left elbow: Secondary | ICD-10-CM | POA: Diagnosis not present

## 2023-11-27 DIAGNOSIS — D485 Neoplasm of uncertain behavior of skin: Secondary | ICD-10-CM | POA: Diagnosis not present

## 2023-11-27 DIAGNOSIS — L820 Inflamed seborrheic keratosis: Secondary | ICD-10-CM | POA: Diagnosis not present

## 2023-11-27 DIAGNOSIS — B079 Viral wart, unspecified: Secondary | ICD-10-CM | POA: Diagnosis not present

## 2023-11-27 DIAGNOSIS — L821 Other seborrheic keratosis: Secondary | ICD-10-CM | POA: Diagnosis not present

## 2023-11-28 DIAGNOSIS — E785 Hyperlipidemia, unspecified: Secondary | ICD-10-CM | POA: Diagnosis not present

## 2023-12-04 DIAGNOSIS — J309 Allergic rhinitis, unspecified: Secondary | ICD-10-CM | POA: Diagnosis not present

## 2023-12-04 DIAGNOSIS — Z789 Other specified health status: Secondary | ICD-10-CM | POA: Diagnosis not present

## 2023-12-04 DIAGNOSIS — I10 Essential (primary) hypertension: Secondary | ICD-10-CM | POA: Diagnosis not present

## 2023-12-04 DIAGNOSIS — E663 Overweight: Secondary | ICD-10-CM | POA: Diagnosis not present

## 2023-12-04 DIAGNOSIS — E782 Mixed hyperlipidemia: Secondary | ICD-10-CM | POA: Diagnosis not present

## 2024-02-27 DIAGNOSIS — Z860100 Personal history of colon polyps, unspecified: Secondary | ICD-10-CM | POA: Diagnosis not present

## 2024-02-27 DIAGNOSIS — Z09 Encounter for follow-up examination after completed treatment for conditions other than malignant neoplasm: Secondary | ICD-10-CM | POA: Diagnosis not present

## 2024-04-26 ENCOUNTER — Other Ambulatory Visit: Payer: Self-pay | Admitting: Family Medicine

## 2024-04-26 DIAGNOSIS — Z1231 Encounter for screening mammogram for malignant neoplasm of breast: Secondary | ICD-10-CM

## 2024-05-27 DIAGNOSIS — Z1331 Encounter for screening for depression: Secondary | ICD-10-CM | POA: Diagnosis not present

## 2024-05-27 DIAGNOSIS — Z0000 Encounter for general adult medical examination without abnormal findings: Secondary | ICD-10-CM | POA: Diagnosis not present

## 2024-05-27 DIAGNOSIS — E1165 Type 2 diabetes mellitus with hyperglycemia: Secondary | ICD-10-CM | POA: Diagnosis not present

## 2024-05-27 DIAGNOSIS — E559 Vitamin D deficiency, unspecified: Secondary | ICD-10-CM | POA: Diagnosis not present

## 2024-05-27 DIAGNOSIS — Z23 Encounter for immunization: Secondary | ICD-10-CM | POA: Diagnosis not present

## 2024-05-27 DIAGNOSIS — Z1339 Encounter for screening examination for other mental health and behavioral disorders: Secondary | ICD-10-CM | POA: Diagnosis not present

## 2024-05-27 DIAGNOSIS — I10 Essential (primary) hypertension: Secondary | ICD-10-CM | POA: Diagnosis not present

## 2024-05-27 DIAGNOSIS — M8580 Other specified disorders of bone density and structure, unspecified site: Secondary | ICD-10-CM | POA: Diagnosis not present

## 2024-05-27 DIAGNOSIS — Z720 Tobacco use: Secondary | ICD-10-CM | POA: Diagnosis not present

## 2024-06-01 DIAGNOSIS — Z13220 Encounter for screening for lipoid disorders: Secondary | ICD-10-CM | POA: Diagnosis not present

## 2024-06-01 DIAGNOSIS — E559 Vitamin D deficiency, unspecified: Secondary | ICD-10-CM | POA: Diagnosis not present

## 2024-06-01 DIAGNOSIS — R5383 Other fatigue: Secondary | ICD-10-CM | POA: Diagnosis not present

## 2024-06-02 ENCOUNTER — Encounter: Payer: Self-pay | Admitting: *Deleted

## 2024-06-02 NOTE — Progress Notes (Signed)
 Christine Lopez                                          MRN: [id number]   06/02/2024   The [facility] Quality Team Specialist reviewed this patient medical record for the purposes of chart review for care gap closure. The following were reviewed: chart review for care gap closure-kidney health evaluation for diabetes:eGFR  and uACR.    [facility] Quality Team

## 2024-06-08 DIAGNOSIS — R7401 Elevation of levels of liver transaminase levels: Secondary | ICD-10-CM | POA: Diagnosis not present

## 2024-06-08 DIAGNOSIS — Z0000 Encounter for general adult medical examination without abnormal findings: Secondary | ICD-10-CM | POA: Diagnosis not present

## 2024-06-08 DIAGNOSIS — R7301 Impaired fasting glucose: Secondary | ICD-10-CM | POA: Diagnosis not present

## 2024-06-22 ENCOUNTER — Ambulatory Visit
Admission: RE | Admit: 2024-06-22 | Discharge: 2024-06-22 | Disposition: A | Discharge status: Home / Self Care | Source: Ambulatory Visit | Attending: Family Medicine | Admitting: Family Medicine

## 2024-06-22 DIAGNOSIS — Z1231 Encounter for screening mammogram for malignant neoplasm of breast: Secondary | ICD-10-CM | POA: Diagnosis not present

## 2024-06-29 DIAGNOSIS — B9689 Other specified bacterial agents as the cause of diseases classified elsewhere: Secondary | ICD-10-CM | POA: Diagnosis not present

## 2024-06-29 DIAGNOSIS — H6502 Acute serous otitis media, left ear: Secondary | ICD-10-CM | POA: Diagnosis not present

## 2024-06-29 DIAGNOSIS — J069 Acute upper respiratory infection, unspecified: Secondary | ICD-10-CM | POA: Diagnosis not present

## 2024-06-29 DIAGNOSIS — J329 Chronic sinusitis, unspecified: Secondary | ICD-10-CM | POA: Diagnosis not present

## 2024-06-30 DIAGNOSIS — E2839 Other primary ovarian failure: Secondary | ICD-10-CM | POA: Diagnosis not present

## 2024-07-05 DIAGNOSIS — H2513 Age-related nuclear cataract, bilateral: Secondary | ICD-10-CM | POA: Diagnosis not present

## 2024-07-05 DIAGNOSIS — H40013 Open angle with borderline findings, low risk, bilateral: Secondary | ICD-10-CM | POA: Diagnosis not present

## 2024-07-05 DIAGNOSIS — H524 Presbyopia: Secondary | ICD-10-CM | POA: Diagnosis not present
